# Patient Record
Sex: Female | Born: 1960 | Race: Black or African American | Hispanic: No | Smoking: Former smoker
Health system: Southern US, Community
[De-identification: ages and names within clinical notes are randomized; demographics above are authoritative.]

## PROBLEM LIST (undated history)

## (undated) DIAGNOSIS — E785 Hyperlipidemia, unspecified: Secondary | ICD-10-CM

## (undated) DIAGNOSIS — E119 Type 2 diabetes mellitus without complications: Secondary | ICD-10-CM

## (undated) HISTORY — DX: Type 2 diabetes mellitus without complications: E11.9

## (undated) HISTORY — DX: Hyperlipidemia, unspecified: E78.5

---

## 2000-11-08 HISTORY — PX: ABDOMINAL HYSTERECTOMY: SHX81

## 2002-08-19 ENCOUNTER — Emergency Department (HOSPITAL_COMMUNITY): Admission: EM | Admit: 2002-08-19 | Discharge: 2002-08-19 | Payer: Self-pay | Admitting: Emergency Medicine

## 2002-08-19 ENCOUNTER — Encounter: Payer: Self-pay | Admitting: Emergency Medicine

## 2003-09-13 ENCOUNTER — Other Ambulatory Visit: Admission: RE | Admit: 2003-09-13 | Discharge: 2003-09-13 | Payer: Self-pay | Admitting: Family Medicine

## 2003-10-07 ENCOUNTER — Ambulatory Visit (HOSPITAL_COMMUNITY): Admission: RE | Admit: 2003-10-07 | Discharge: 2003-10-07 | Payer: Self-pay | Admitting: Family Medicine

## 2004-12-30 ENCOUNTER — Other Ambulatory Visit: Admission: RE | Admit: 2004-12-30 | Discharge: 2004-12-30 | Payer: Self-pay | Admitting: Family Medicine

## 2006-02-01 ENCOUNTER — Other Ambulatory Visit: Admission: RE | Admit: 2006-02-01 | Discharge: 2006-02-01 | Payer: Self-pay | Admitting: Family Medicine

## 2007-04-05 ENCOUNTER — Other Ambulatory Visit: Admission: RE | Admit: 2007-04-05 | Discharge: 2007-04-05 | Payer: Self-pay | Admitting: Family Medicine

## 2008-05-22 ENCOUNTER — Ambulatory Visit (HOSPITAL_COMMUNITY): Admission: RE | Admit: 2008-05-22 | Discharge: 2008-05-22 | Payer: Self-pay | Admitting: Internal Medicine

## 2008-07-09 ENCOUNTER — Other Ambulatory Visit: Admission: RE | Admit: 2008-07-09 | Discharge: 2008-07-09 | Payer: Self-pay | Admitting: Family Medicine

## 2009-06-03 ENCOUNTER — Ambulatory Visit (HOSPITAL_COMMUNITY): Admission: RE | Admit: 2009-06-03 | Discharge: 2009-06-03 | Payer: Self-pay | Admitting: Internal Medicine

## 2009-06-11 ENCOUNTER — Encounter: Admission: RE | Admit: 2009-06-11 | Discharge: 2009-06-11 | Payer: Self-pay | Admitting: Family Medicine

## 2009-08-15 ENCOUNTER — Other Ambulatory Visit: Admission: RE | Admit: 2009-08-15 | Discharge: 2009-08-15 | Payer: Self-pay | Admitting: Family Medicine

## 2010-09-22 ENCOUNTER — Other Ambulatory Visit: Admission: RE | Admit: 2010-09-22 | Discharge: 2010-09-22 | Payer: Self-pay | Admitting: Family Medicine

## 2013-11-19 ENCOUNTER — Other Ambulatory Visit

## 2013-11-19 DIAGNOSIS — Z0000 Encounter for general adult medical examination without abnormal findings: Secondary | ICD-10-CM

## 2013-11-19 LAB — HEMOGLOBIN A1C
Hgb A1c MFr Bld: 6.6 % — ABNORMAL HIGH (ref <5.7)
MEAN PLASMA GLUCOSE: 143 mg/dL — AB (ref <117)

## 2013-11-19 LAB — COMPLETE METABOLIC PANEL WITH GFR
ALBUMIN: 3.8 g/dL (ref 3.5–5.2)
ALT: 12 U/L (ref 0–35)
AST: 14 U/L (ref 0–37)
Alkaline Phosphatase: 91 U/L (ref 39–117)
BUN: 12 mg/dL (ref 6–23)
CALCIUM: 9.1 mg/dL (ref 8.4–10.5)
CHLORIDE: 106 meq/L (ref 96–112)
CO2: 28 mEq/L (ref 19–32)
CREATININE: 1.03 mg/dL (ref 0.50–1.10)
GFR, EST AFRICAN AMERICAN: 72 mL/min
GFR, EST NON AFRICAN AMERICAN: 63 mL/min
GLUCOSE: 103 mg/dL — AB (ref 70–99)
POTASSIUM: 4.8 meq/L (ref 3.5–5.3)
Sodium: 143 mEq/L (ref 135–145)
Total Bilirubin: 0.5 mg/dL (ref 0.3–1.2)
Total Protein: 7 g/dL (ref 6.0–8.3)

## 2013-11-19 LAB — LIPID PANEL
CHOL/HDL RATIO: 3.1 ratio
Cholesterol: 159 mg/dL (ref 0–200)
HDL: 52 mg/dL (ref >39)
LDL CALC: 93 mg/dL (ref 0–99)
TRIGLYCERIDES: 68 mg/dL (ref <150)
VLDL: 14 mg/dL (ref 0–40)

## 2013-11-19 LAB — TSH: TSH: 1.861 u[IU]/mL (ref 0.350–4.500)

## 2013-11-20 LAB — VITAMIN D 25 HYDROXY (VIT D DEFICIENCY, FRACTURES): Vit D, 25-Hydroxy: 15 ng/mL — ABNORMAL LOW (ref 30–89)

## 2013-11-26 ENCOUNTER — Telehealth: Payer: Self-pay | Admitting: Family Medicine

## 2013-11-26 ENCOUNTER — Ambulatory Visit (INDEPENDENT_AMBULATORY_CARE_PROVIDER_SITE_OTHER): Admitting: Family Medicine

## 2013-11-26 ENCOUNTER — Encounter: Payer: Self-pay | Admitting: Family Medicine

## 2013-11-26 VITALS — BP 120/80 | HR 78 | Temp 97.7°F | Resp 18 | Ht 64.5 in | Wt 264.0 lb

## 2013-11-26 DIAGNOSIS — E119 Type 2 diabetes mellitus without complications: Secondary | ICD-10-CM | POA: Insufficient documentation

## 2013-11-26 DIAGNOSIS — E559 Vitamin D deficiency, unspecified: Secondary | ICD-10-CM

## 2013-11-26 DIAGNOSIS — M79609 Pain in unspecified limb: Secondary | ICD-10-CM

## 2013-11-26 DIAGNOSIS — Z1231 Encounter for screening mammogram for malignant neoplasm of breast: Secondary | ICD-10-CM

## 2013-11-26 DIAGNOSIS — Z0000 Encounter for general adult medical examination without abnormal findings: Secondary | ICD-10-CM

## 2013-11-26 DIAGNOSIS — M79672 Pain in left foot: Secondary | ICD-10-CM

## 2013-11-26 DIAGNOSIS — Z23 Encounter for immunization: Secondary | ICD-10-CM

## 2013-11-26 LAB — MICROALBUMIN / CREATININE URINE RATIO
CREATININE, URINE: 192 mg/dL
MICROALB UR: 0.5 mg/dL (ref 0.00–1.89)
Microalb Creat Ratio: 2.6 mg/g (ref 0.0–30.0)

## 2013-11-26 MED ORDER — VITAMIN D (ERGOCALCIFEROL) 1.25 MG (50000 UNIT) PO CAPS: 50000.0000 [IU] | ORAL_CAPSULE | ORAL | Status: DC

## 2013-11-26 NOTE — Addendum Note (Signed)
Addended by: Elvina MattesSIMMONS, Kinnick Maus T on: 11/26/2013 04:26 PM   Modules accepted: Orders

## 2013-11-26 NOTE — Progress Notes (Signed)
   Subjective:    Patient ID: Angela Bates, female    DOB: 07/25/1961, 53 y.o.   MRN: [id number]  HPI  Patient here for physical exam. She's concerned about left foot pain for the past 6 months. She began having throbbing in her left foot became out of nowhere she subsequently had a young child fall with her ankles she's had increased pain since then. She states it typically stays swollen even though she tries elevated. She's not tried any over-the-counter medications. She's not had any imaging of the foot. She did notice that the skin became darker around the ankle where she has pain. She has history of borderline diabetes mellitus her last A1c was 5.8% in 2012  She is due for mammogram She's not a candidate for Pap smear secondary to hysterectomy for fibroid uterus Colonoscopy was done 08/06/11 and was normal to be repeated in 10 years    Review of Systems  GEN- denies fatigue, fever, weight loss,weakness, recent illness HEENT- denies eye drainage, change in vision, nasal discharge, CVS- denies chest pain, palpitations RESP- denies SOB, cough, wheeze ABD- denies N/V, change in stools, abd pain GU- denies dysuria, hematuria, dribbling, incontinence MSK- + joint pain, muscle aches, injury Neuro- denies headache, dizziness, syncope, seizure activity      Objective:   Physical Exam GEN- NAD, alert and oriented x3, obese HEENT- PERRL, EOMI, non injected sclera, pink conjunctiva, MMM, oropharynx clear Neck- Supple, no thryomegaly CVS- RRR, no murmur RESP-CTAB ABD-NABS,soft,NT,ND GU-Deferred EXT- trace left pedal edema Pulses- Radial, DP- 2+ MSK- bilat flat, feet- swelling medial malleous left foot, hyperpigementation over ankle region, TTP malleous, neg squeeze test, good ROM        Assessment & Plan:   V70.0- Exam done, fasting labs, TDAP/Flu shot given    Mammogram to be done  Fasting labs were reviewed

## 2013-11-26 NOTE — Assessment & Plan Note (Addendum)
She's not to herself and to the diabetic category. Her last A1c was 5.8% 2 years ago. She is now 6.6% today. She would is really adamant about working on her diet and weight loss before starting medications and I think this is reasonable at this early time. Will give her 3 months to work on dietary and lifestyle changes. She was shown how to use a glucometer so that she can monitor her CBGs a few times a week and also she begins to feels sick. FLP, CMET wnl

## 2013-11-26 NOTE — Telephone Encounter (Signed)
Pt has a question about her medication Call back number [phone]

## 2013-11-26 NOTE — Telephone Encounter (Signed)
Pt called back and spoke about the medications

## 2013-11-26 NOTE — Assessment & Plan Note (Signed)
Treat with ergocalciferol x12 weeks

## 2013-11-26 NOTE — Patient Instructions (Addendum)
Referral for Mammogram Referral for eye doctor-[place] Tetanus Booster/Flu shot  Meter to test blood sugar -test fasting- M,W,F goal < 90-120 morning  Review handout on foods Multivitamin once  A day Vitamin D for 12 weeks Xray of left foot  Take aleve for the foot and elevate F/U 3 months Diabetes Meal Planning Guide The diabetes meal planning guide is a tool to help you plan your meals and snacks. It is important for people with diabetes to manage their blood glucose (sugar) levels. Choosing the right foods and the right amounts throughout your day will help control your blood glucose. Eating right can even help you improve your blood pressure and reach or maintain a healthy weight. CARBOHYDRATE COUNTING MADE EASY When you eat carbohydrates, they turn to sugar. This raises your blood glucose level. Counting carbohydrates can help you control this level so you feel better. When you plan your meals by counting carbohydrates, you can have more flexibility in what you eat and balance your medicine with your food intake. Carbohydrate counting simply means adding up the total amount of carbohydrate grams in your meals and snacks. Try to eat about the same amount at each meal. Foods with carbohydrates are listed below. Each portion below is 1 carbohydrate serving or 15 grams of carbohydrates. Ask your dietician how many grams of carbohydrates you should eat at each meal or snack. Grains and Starches  1 slice bread.   English muffin or hotdog/hamburger bun.   cup cold cereal (unsweetened).   cup cooked pasta or rice.   cup starchy vegetables (corn, potatoes, peas, beans, winter squash).  1 tortilla (6 inches).   bagel.  1 waffle or pancake (size of a CD).   cup cooked cereal.  4 to 6 small crackers. *Whole grain is recommended. Fruit  1 cup fresh unsweetened berries, melon, papaya, pineapple.  1 small fresh fruit.   banana or mango.   cup fruit juice (4 oz  unsweetened).   cup canned fruit in natural juice or water.  2 tbs dried fruit.  12 to 15 grapes or cherries. Milk and Yogurt  1 cup fat-free or 1% milk.  1 cup soy milk.  6 oz light yogurt with sugar-free sweetener.  6 oz low-fat soy yogurt.  6 oz plain yogurt. Vegetables  1 cup raw or  cup cooked is counted as 0 carbohydrates or a "free" food.  If you eat 3 or more servings at 1 meal, count them as 1 carbohydrate serving. Other Carbohydrates   oz chips or pretzels.   cup ice cream or frozen yogurt.   cup sherbet or sorbet.  2 inch square cake, no frosting.  1 tbs honey, sugar, jam, jelly, or syrup.  2 small cookies.  3 squares of graham crackers.  3 cups popcorn.  6 crackers.  1 cup broth-based soup.  Count 1 cup casserole or other mixed foods as 2 carbohydrate servings.  Foods with less than 20 calories in a serving may be counted as 0 carbohydrates or a "free" food. You may want to purchase a book or computer software that lists the carbohydrate gram counts of different foods. In addition, the nutrition facts panel on the labels of the foods you eat are a good source of this information. The label will tell you how big the serving size is and the total number of carbohydrate grams you will be eating per serving. Divide this number by 15 to obtain the number of carbohydrate servings in a portion. Remember,  1 carbohydrate serving equals 15 grams of carbohydrate. SERVING SIZES Measuring foods and serving sizes helps you make sure you are getting the right amount of food. The list below tells how big or small some common serving sizes are.  1 oz.........4 stacked dice.  3 oz........Marland Kitchen.Deck of cards.  1 tsp.......Marland Kitchen.Tip of little finger.  1 tbs......Marland Kitchen.Marland Kitchen.Thumb.  2 tbs.......Marland Kitchen.Golf ball.   cup......Marland Kitchen.Half of a fist.  1 cup.......Marland Kitchen.A fist. SAMPLE DIABETES MEAL PLAN Below is a sample meal plan that includes foods from the grain and starches, dairy, vegetable,  fruit, and meat groups. A dietician can individualize a meal plan to fit your calorie needs and tell you the number of servings needed from each food group. However, controlling the total amount of carbohydrates in your meal or snack is more important than making sure you include all of the food groups at every meal. You may interchange carbohydrate containing foods (dairy, starches, and fruits). The meal plan below is an example of a 2000 calorie diet using carbohydrate counting. This meal plan has 17 carbohydrate servings. Breakfast  1 cup oatmeal (2 carb servings).   cup light yogurt (1 carb serving).  1 cup blueberries (1 carb serving).   cup almonds. Snack  1 large apple (2 carb servings).  1 low-fat string cheese stick. Lunch  Chicken breast salad.  1 cup spinach.   cup chopped tomatoes.  2 oz chicken breast, sliced.  2 tbs low-fat [place] dressing.  12 whole-wheat crackers (2 carb servings).  12 to 15 grapes (1 carb serving).  1 cup low-fat milk (1 carb serving). Snack  1 cup carrots.   cup hummus (1 carb serving). Dinner  3 oz broiled salmon.  1 cup brown rice (3 carb servings). Snack  1  cups steamed broccoli (1 carb serving) drizzled with 1 tsp olive oil and lemon juice.  1 cup light pudding (2 carb servings). DIABETES MEAL PLANNING WORKSHEET Your dietician can use this worksheet to help you decide how many servings of foods and what types of foods are right for you.  BREAKFAST Food Group and Servings / Carb Servings Grain/Starches __________________________________ Dairy __________________________________________ Vegetable ______________________________________ Fruit ___________________________________________ Meat __________________________________________ Fat ____________________________________________ LUNCH Food Group and Servings / Carb Servings Grain/Starches ___________________________________ Dairy  ___________________________________________ Fruit ____________________________________________ Meat ___________________________________________ Fat _____________________________________________ Laural GoldenINNER Food Group and Servings / Carb Servings Grain/Starches ___________________________________ Dairy ___________________________________________ Fruit ____________________________________________ Meat ___________________________________________ Fat _____________________________________________ SNACKS Food Group and Servings / Carb Servings Grain/Starches ___________________________________ Dairy ___________________________________________ Vegetable _______________________________________ Fruit ____________________________________________ Meat ___________________________________________ Fat _____________________________________________ DAILY TOTALS Starches _________________________ Vegetable ________________________ Fruit ____________________________ Dairy ____________________________ Meat ____________________________ Fat ______________________________ Document Released: 07/22/2005 Document Revised: 01/17/2012 Document Reviewed: 06/02/2009 [employer] Patient Information 2014 [employer]

## 2013-11-26 NOTE — Assessment & Plan Note (Signed)
Short-term weight loss goal set. Given handouts on diet

## 2013-11-30 ENCOUNTER — Encounter: Payer: Self-pay | Admitting: *Deleted

## 2013-12-05 ENCOUNTER — Ambulatory Visit (HOSPITAL_COMMUNITY)
Admission: RE | Admit: 2013-12-05 | Discharge: 2013-12-05 | Disposition: A | Discharge status: Home / Self Care | Source: Ambulatory Visit | Attending: Family Medicine | Admitting: Family Medicine

## 2013-12-05 DIAGNOSIS — M79672 Pain in left foot: Secondary | ICD-10-CM

## 2013-12-05 DIAGNOSIS — M25579 Pain in unspecified ankle and joints of unspecified foot: Secondary | ICD-10-CM | POA: Insufficient documentation

## 2013-12-07 NOTE — Progress Notes (Signed)
LMTRC

## 2013-12-17 ENCOUNTER — Ambulatory Visit (HOSPITAL_COMMUNITY)
Admission: RE | Admit: 2013-12-17 | Discharge: 2013-12-17 | Disposition: A | Discharge status: Home / Self Care | Source: Ambulatory Visit | Attending: Family Medicine | Admitting: Family Medicine

## 2013-12-17 DIAGNOSIS — Z1231 Encounter for screening mammogram for malignant neoplasm of breast: Secondary | ICD-10-CM

## 2014-02-11 ENCOUNTER — Ambulatory Visit
Admission: RE | Admit: 2014-02-11 | Discharge: 2014-02-11 | Disposition: A | Discharge status: Home / Self Care | Source: Ambulatory Visit | Attending: Family Medicine | Admitting: Family Medicine

## 2014-02-11 ENCOUNTER — Encounter: Payer: Self-pay | Admitting: Family Medicine

## 2014-02-11 ENCOUNTER — Ambulatory Visit (INDEPENDENT_AMBULATORY_CARE_PROVIDER_SITE_OTHER): Admitting: Family Medicine

## 2014-02-11 VITALS — BP 138/72 | HR 68 | Temp 98.1°F | Resp 14 | Ht 64.5 in | Wt 253.0 lb

## 2014-02-11 DIAGNOSIS — M25579 Pain in unspecified ankle and joints of unspecified foot: Secondary | ICD-10-CM

## 2014-02-11 DIAGNOSIS — M21619 Bunion of unspecified foot: Secondary | ICD-10-CM

## 2014-02-11 LAB — URIC ACID: Uric Acid, Serum: 4.7 mg/dL (ref 2.4–7.0)

## 2014-02-11 MED ORDER — MELOXICAM 7.5 MG PO TABS: 7.5000 mg | ORAL_TABLET | Freq: Every day | ORAL | Status: DC

## 2014-02-11 NOTE — Patient Instructions (Signed)
Start meloxicam once a day Use ICE Referral to foot doctor We will call with labs F/U as previous

## 2014-02-11 NOTE — Assessment & Plan Note (Addendum)
She typically has some swelling across the midfoot as well as the ankle but without any injury. S he's also had swelling on her left foot as well in the past with a negative x-ray. Her ligaments appear to be intact. I will start her on meloxicam. I will check a uric acid level.  Her symptoms are not classic for diabetic neuropathy therefore I will send her to podiatry she also has some bunions that need to be addressed with her foot

## 2014-02-11 NOTE — Progress Notes (Signed)
Patient ID: Angela Bates, female   DOB: 07/05/1961, 53 y.o.   MRN: [id number]   Subjective:    Patient ID: Angela Bates, female    DOB: 05/01/1961, 53 y.o.   MRN: [id number]  Patient presents for R leg pain  patient here with right foot pain for the past week. She denies any particular injury. She has pain which felt like a burning sensation and then severe sharp pain across the top of her foot. When she bears weight on it she has pain as well. She did have a similar pain on her left foot the medial aspect of the ankle x-rays were negative. She's not taking any over-the-counter medication. She does have diabetes mellitus which is currently diet controlled. She denies any tingling or burning on the soles of her feet or in her toes. She denies any back pain or knee pain. She states that she has difficulty walking on the right foot for the past couple of days. He also complains of a bunion on her right foot.    Review Of Systems:  GEN- denies fatigue, fever, weight loss,weakness, recent illness MSK-+ joint pain, muscle aches, injury        Objective:    BP 138/72  Pulse 68  Temp(Src) 98.1 F (36.7 C) (Oral)  Resp 14  Ht 5' 4.5" (1.638 m)  Wt 253 lb (114.76 kg)  BMI 42.77 kg/m2 GEN- NAD, alert and oriented x3 MSK- Right foot swelling along midfoot and lateral malleolus, +squeeze test, pain with flexion of foot, pain with weight bearing. Ligaments in tact, no pain with ROM of ankle, Left foot, mild swelling at ankles, no pain with ROM Pulse- DP 2+, PT in tact,  Skin- small varicose veins on bilat feet, NT, no erythema, mild hyperpigmentation at heels and lateral aspect of feet        Assessment & Plan:      Problem List Items Addressed This Visit   Pain in joint, ankle and foot - Primary     She typically has some swelling across the midfoot as well as the ankle but without any injury. S he's also had swelling on her left foot as well in the past with a negative x-ray. Her  ligaments appear to be intact. I will start her on meloxicam. I will check a uric acid level.  Her symptoms are not classic for diabetic neuropathy therefore I will send her to podiatry she also has some bunions that need to be addressed with her foot    Relevant Orders      Uric Acid      Ambulatory referral to Podiatry   Bunion of great toe   Relevant Orders      Ambulatory referral to Podiatry      Note: This dictation was prepared with Dragon dictation along with smaller phrase technology. Any transcriptional errors that result from this process are unintentional.

## 2014-02-18 ENCOUNTER — Other Ambulatory Visit: Payer: Self-pay | Admitting: Family Medicine

## 2014-02-18 MED ORDER — VITAMIN D3 25 MCG (1000 UNIT) PO TABS: 1000.0000 [IU] | ORAL_TABLET | Freq: Every day | ORAL | Status: DC

## 2014-02-18 NOTE — Telephone Encounter (Signed)
Refill of Vitamin D2 denied.   OTC Vitamin D3 begun per protocol.

## 2014-02-25 ENCOUNTER — Encounter: Payer: Self-pay | Admitting: Family Medicine

## 2014-02-25 ENCOUNTER — Ambulatory Visit (INDEPENDENT_AMBULATORY_CARE_PROVIDER_SITE_OTHER): Admitting: Family Medicine

## 2014-02-25 VITALS — BP 136/90 | HR 64 | Temp 97.6°F | Resp 18 | Ht 65.0 in | Wt 253.0 lb

## 2014-02-25 DIAGNOSIS — E119 Type 2 diabetes mellitus without complications: Secondary | ICD-10-CM

## 2014-02-25 DIAGNOSIS — E559 Vitamin D deficiency, unspecified: Secondary | ICD-10-CM

## 2014-02-25 DIAGNOSIS — Z23 Encounter for immunization: Secondary | ICD-10-CM

## 2014-02-25 LAB — HEMOGLOBIN A1C
HEMOGLOBIN A1C: 6 % — AB (ref <5.7)
Mean Plasma Glucose: 126 mg/dL — ABNORMAL HIGH (ref <117)

## 2014-02-25 LAB — BASIC METABOLIC PANEL
BUN: 19 mg/dL (ref 6–23)
CALCIUM: 9.3 mg/dL (ref 8.4–10.5)
CO2: 28 meq/L (ref 19–32)
CREATININE: 0.95 mg/dL (ref 0.50–1.10)
Chloride: 104 mEq/L (ref 96–112)
GLUCOSE: 93 mg/dL (ref 70–99)
Potassium: 4.6 mEq/L (ref 3.5–5.3)
SODIUM: 141 meq/L (ref 135–145)

## 2014-02-25 NOTE — Progress Notes (Signed)
Patient ID: Angela Bates, female   DOB: 06/07/1961, 53 y.o.   MRN: [id number]   Subjective:    Patient ID: Angela Bates, female    DOB: 06/27/1961, 53 y.o.   MRN: [id number]  Patient presents for 3 month F/U  patient here to follow chronic medical problems. She's lost 11 pounds since her last visit 3 months ago. She was also noted to have an A1c of 6.6%. She was controlling her diabetes with diet for the past 3 months. Her cholesterol was within normal limits 3 months ago. She had recent eye exam by Dr. Delrae SawyersHecker Sugar fasting this morning was 108     Review Of Systems:  GEN- denies fatigue, fever, weight loss,weakness, recent illness HEENT- denies eye drainage, change in vision, nasal discharge, CVS- denies chest pain, palpitations RESP- denies SOB, cough, wheeze ABD- denies N/V, change in stools, abd pain GU- denies dysuria, hematuria, dribbling, incontinence MSK- denies joint pain, muscle aches, injury Neuro- denies headache, dizziness, syncope, seizure activity       Objective:    BP 136/90  Pulse 64  Temp(Src) 97.6 F (36.4 C) (Oral)  Resp 18  Ht 5\' 5"  (1.651 m)  Wt 253 lb (114.76 kg)  BMI 42.10 kg/m2 GEN- NAD, alert and oriented x3 HEENT- PERRL, EOMI, non injected sclera, pink conjunctiva, MMM, oropharynx clear CVS- RRR, no murmur RESP-CTAB ABD-NABS,soft,NT,ND EXT- trace pedal edema Pulses- Radial, DP- 2+        Assessment & Plan:      Problem List Items Addressed This Visit   Unspecified vitamin D deficiency   Relevant Orders      Vitamin D, 25-hydroxy   Type II or unspecified type diabetes mellitus without mention of complication, not stated as uncontrolled - Primary     Recheck A1c. We did discuss medications such a statin drugs and ACE inhibitor she has not completely against these if needed but she wants to limit how many medications she hasn't taken prefers to treat things with diet and exercise. I will check her renal function. Her LDL looks good. Her  urine microalbumin was also normal. She is right at the cutoff for diabetes mellitus therefore I will await labs before deciding on to start her on low-dose ACE or statin    Relevant Orders      Basic metabolic panel      Hemoglobin A1c      Pneumococcal polysaccharide vaccine 23-valent greater than or equal to 2yo subcutaneous/IM (Completed)   Morbid obesity     11 pound weight loss noted.     Other Visit Diagnoses   Need for prophylactic vaccination against Streptococcus pneumoniae (pneumococcus)        Relevant Orders       Pneumococcal polysaccharide vaccine 23-valent greater than or equal to 2yo subcutaneous/IM (Completed)       Note: This dictation was prepared with Dragon dictation along with smaller phrase technology. Any transcriptional errors that result from this process are unintentional.

## 2014-02-25 NOTE — Patient Instructions (Addendum)
Release of records- Dr. Elmer PickerHecker- last eye exam We will call with results  Pneumonia shot  F/U 4 months

## 2014-02-25 NOTE — Assessment & Plan Note (Signed)
11 pound weight loss noted.

## 2014-02-25 NOTE — Assessment & Plan Note (Signed)
Recheck A1c. We did discuss medications such a statin drugs and ACE inhibitor she has not completely against these if needed but she wants to limit how many medications she hasn't taken prefers to treat things with diet and exercise. I will check her renal function. Her LDL looks good. Her urine microalbumin was also normal. She is right at the cutoff for diabetes mellitus therefore I will await labs before deciding on to start her on low-dose ACE or statin

## 2014-02-26 LAB — VITAMIN D 25 HYDROXY (VIT D DEFICIENCY, FRACTURES): Vit D, 25-Hydroxy: 37 ng/mL (ref 30–89)

## 2014-02-28 ENCOUNTER — Other Ambulatory Visit: Payer: Self-pay | Admitting: *Deleted

## 2014-02-28 MED ORDER — PRAVASTATIN SODIUM 10 MG PO TABS: 10.0000 mg | ORAL_TABLET | Freq: Every day | ORAL | Status: DC

## 2014-03-08 ENCOUNTER — Ambulatory Visit (INDEPENDENT_AMBULATORY_CARE_PROVIDER_SITE_OTHER)

## 2014-03-08 VITALS — BP 108/52 | HR 79 | Resp 16 | Ht 66.0 in | Wt 250.0 lb

## 2014-03-08 DIAGNOSIS — M7750 Other enthesopathy of unspecified foot: Secondary | ICD-10-CM

## 2014-03-08 DIAGNOSIS — M1990 Unspecified osteoarthritis, unspecified site: Secondary | ICD-10-CM

## 2014-03-08 DIAGNOSIS — M778 Other enthesopathies, not elsewhere classified: Secondary | ICD-10-CM

## 2014-03-08 DIAGNOSIS — M779 Enthesopathy, unspecified: Secondary | ICD-10-CM

## 2014-03-08 NOTE — Progress Notes (Signed)
   Subjective:    Patient ID: Margy Clarksllison Brines, female    DOB: 06/17/1961, 53 y.o.   MRN: [id number]  HPI Comments: "I have pain in the top of my foot"  Patient c/o aching dorsal right foot for about 1 month. She said it started suddenly one morning when she got out of bed. The area swells of and on. No injury. She saw her PCP and Rx 'd meloxicam. Some relief but wears off midday.      Review of Systems  Musculoskeletal: Positive for gait problem.  All other systems reviewed and are negative.      Objective:   Physical Exam A objective findings as follows vascular status is intact with pedal pulses palpable DP pulse two over four PT plus one over 4 bilateral capillary refill time 3 seconds all digits epicritic and proprioceptive sensations intact and symmetric bilateral there is normal plantar response and DTRs patient has diabetes without complications the current time. Dermatologically skin color pigment normal hair growth absent nails normal trophic. Orthopedic biomechanical exam is rectus foot type with promontory changes on weightbearing x-rays reveal collapse of the medial column at Lisfranc's in the mid tarsus joint area there is some asymmetric joint space narrowing mid tarsus and Lisfranc joint on oblique and lateral projections no signs of obvious fracture or other osseous abnormality identified as mild HAV deformity mild digital contractures noted slight inferior calcaneal spurring and thickening plantar fascial structures noted as well.       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 Plan at this time patient placed in fascial strapping may recommendations for a stiff are stable firm soled shoe at all times recheck in 2 weeks for followup patient RE taking meloxicam for 7.50 g as needed and will continue to do so recommend warm compress ice packs as well she  recommendations for a firm stable shoe including crocs for around the house are given at this time should note patient currently is been wearing very flimsy type shoes or sandals or Luanna ColeMary J. exam no structure support this allowed medial column in the foot arch to collapse significantly. Followup in 2 weeks for reevaluation may be a strong candidate for orthoses based on progress  Alvan Dameichard Pami Wool DPM

## 2014-03-08 NOTE — Patient Instructions (Signed)
ICE INSTRUCTIONS  Apply ice or cold pack to the affected area at least 3 times a day for 10-15 minutes each time.  You should also use ice after prolonged activity or vigorous exercise.  Do not apply ice longer than 20 minutes at one time.  Always keep a cloth between your skin and the ice pack to prevent burns.  Being consistent and following these instructions will help control your symptoms.  We suggest you purchase a gel ice pack because they are reusable and do bit leak.  Some of them are designed to wrap around the area.  Use the method that works best for you.  Here are some other suggestions for icing.   Use a frozen bag of peas or corn-inexpensive and molds well to your body, usually stays frozen for 10 to 20 minutes.  Wet a towel with cold water and squeeze out the excess until it's damp.  Place in a bag in the freezer for 20 minutes. Then remove and use.  Alternate warm compress and ice pack 10 minutes each repeat 2 or 3 times at least once or twice everyday or every evening  Shoe recommendations consider athletic type shoe either new balance or Brooks or Asics. Make sure the shoe has a stiff or firm sole no flimsy shoes or flip-flops For dressier or casual shoe consider [place], [place], or [place]

## 2014-03-22 ENCOUNTER — Ambulatory Visit

## 2014-03-26 ENCOUNTER — Telehealth: Payer: Self-pay | Admitting: *Deleted

## 2014-03-26 NOTE — Telephone Encounter (Signed)
Call placed to patient.   Requested information about pravastatin. States that she has not had high cholesterol and she was concerned about why it was ordered. Advised that LDL is on the high end of normal and with her concurrent dx, MD wanted to add medication to prevent MI/CVA.

## 2014-03-26 NOTE — Telephone Encounter (Signed)
Message copied by Phillips OdorSIX, Trenese Haft H on Tue Mar 26, 2014 10:36 AM ------      Message from: Malvin JohnsBULLINS, SUSAN S      Created: Tue Mar 26, 2014  8:52 AM       [phone]      Patient would like to talk to you about resubmitting one of her medications to the pharmacy and about how she should take one of her medications was not specific on the med she was speaking of  ------

## 2014-04-09 ENCOUNTER — Other Ambulatory Visit: Payer: Self-pay | Admitting: Gastroenterology

## 2014-06-21 ENCOUNTER — Other Ambulatory Visit: Payer: Self-pay | Admitting: Family Medicine

## 2014-06-21 NOTE — Telephone Encounter (Signed)
Refill appropriate and filled per protocol. 

## 2014-06-28 ENCOUNTER — Encounter: Payer: Self-pay | Admitting: Family Medicine

## 2014-06-28 ENCOUNTER — Ambulatory Visit (INDEPENDENT_AMBULATORY_CARE_PROVIDER_SITE_OTHER): Admitting: Family Medicine

## 2014-06-28 VITALS — BP 100/62 | HR 68 | Temp 98.2°F | Resp 18 | Ht 66.0 in | Wt 256.0 lb

## 2014-06-28 DIAGNOSIS — E119 Type 2 diabetes mellitus without complications: Secondary | ICD-10-CM

## 2014-06-28 LAB — COMPREHENSIVE METABOLIC PANEL
ALK PHOS: 92 U/L (ref 39–117)
ALT: 13 U/L (ref 0–35)
AST: 14 U/L (ref 0–37)
Albumin: 3.8 g/dL (ref 3.5–5.2)
BILIRUBIN TOTAL: 0.3 mg/dL (ref 0.2–1.2)
BUN: 17 mg/dL (ref 6–23)
CO2: 26 meq/L (ref 19–32)
CREATININE: 1.1 mg/dL (ref 0.50–1.10)
Calcium: 9.1 mg/dL (ref 8.4–10.5)
Chloride: 105 mEq/L (ref 96–112)
Glucose, Bld: 104 mg/dL — ABNORMAL HIGH (ref 70–99)
Potassium: 5 mEq/L (ref 3.5–5.3)
SODIUM: 139 meq/L (ref 135–145)
TOTAL PROTEIN: 6.9 g/dL (ref 6.0–8.3)

## 2014-06-28 LAB — CBC WITH DIFFERENTIAL/PLATELET
BASOS ABS: 0 10*3/uL (ref 0.0–0.1)
Basophils Relative: 0 % (ref 0–1)
EOS ABS: 0.1 10*3/uL (ref 0.0–0.7)
EOS PCT: 1 % (ref 0–5)
HEMATOCRIT: 36.1 % (ref 36.0–46.0)
Hemoglobin: 11.7 g/dL — ABNORMAL LOW (ref 12.0–15.0)
LYMPHS ABS: 3.9 10*3/uL (ref 0.7–4.0)
LYMPHS PCT: 37 % (ref 12–46)
MCH: 22.8 pg — AB (ref 26.0–34.0)
MCHC: 32.4 g/dL (ref 30.0–36.0)
MCV: 70.2 fL — AB (ref 78.0–100.0)
MONO ABS: 0.5 10*3/uL (ref 0.1–1.0)
Monocytes Relative: 5 % (ref 3–12)
Neutro Abs: 6 10*3/uL (ref 1.7–7.7)
Neutrophils Relative %: 57 % (ref 43–77)
Platelets: 165 10*3/uL (ref 150–400)
RBC: 5.14 MIL/uL — ABNORMAL HIGH (ref 3.87–5.11)
RDW: 17.6 % — AB (ref 11.5–15.5)
WBC: 10.5 10*3/uL (ref 4.0–10.5)

## 2014-06-28 LAB — HEMOGLOBIN A1C
HEMOGLOBIN A1C: 6.4 % — AB (ref <5.7)
MEAN PLASMA GLUCOSE: 137 mg/dL — AB (ref <117)

## 2014-06-28 LAB — LIPID PANEL
CHOL/HDL RATIO: 2.4 ratio
CHOLESTEROL: 136 mg/dL (ref 0–200)
HDL: 56 mg/dL (ref >39)
LDL CALC: 73 mg/dL (ref 0–99)
Triglycerides: 37 mg/dL (ref <150)
VLDL: 7 mg/dL (ref 0–40)

## 2014-06-28 MED ORDER — CHOLECALCIFEROL 25 MCG (1000 UT) PO CAPS: ORAL_CAPSULE | ORAL | Status: DC

## 2014-06-28 NOTE — Patient Instructions (Signed)
Continue current medications F/U 4 months  

## 2014-06-28 NOTE — Progress Notes (Signed)
Patient ID: Angela Bates, female   DOB: 04/24/1961, 53 y.o.   MRN: [id number]   Subjective:    Patient ID: Angela Clarksllison Dedmon, female    DOB: 11/30/1960, 53 y.o.   MRN: [id number]  Patient presents for Med check   Patient here to follow chronic medical problems. She's no specific concerns. She's diabetes mellitus which is diet controlled she typically checks her sugar 2-3 times a week just to keep an eye on it is stable in the low 100s. She's been taking her pravastatin for cardiovascular risk reduction without any difficulties. She's gained 6 pounds back on her previous weight loss she's had a stressful summer as her son was doing with the mental health issues in her uncle who've she was caring for was hospitalized. She denies any feelings of depression or anxiety. Medications were reviewed immunizations are up-to-date    Review Of Systems:  GEN- denies fatigue, fever, weight loss,weakness, recent illness HEENT- denies eye drainage, change in vision, nasal discharge, CVS- denies chest pain, palpitations RESP- denies SOB, cough, wheeze ABD- denies N/V, change in stools, abd pain GU- denies dysuria, hematuria, dribbling, incontinence MSK- denies joint pain, muscle aches, injury Neuro- denies headache, dizziness, syncope, seizure activity       Objective:    BP 100/62  Pulse 68  Temp(Src) 98.2 F (36.8 C) (Oral)  Resp 18  Ht 5\' 6"  (1.676 m)  Wt 256 lb (116.121 kg)  BMI 41.34 kg/m2 GEN- NAD, alert and oriented x3 HEENT- PERRL, EOMI, non injected sclera, pink conjunctiva, MMM, oropharynx clear CVS- RRR, no murmur RESP-CTAB Psych- normal affect and mood EXT- No edema Pulses- Radial, DP- 2+ Skin- feet callus bilat, rough texture        Assessment & Plan:      Problem List Items Addressed This Visit   Type II or unspecified type diabetes mellitus without mention of complication, not stated as uncontrolled - Primary   Relevant Orders      Lipid panel      Hemoglobin A1c   Comprehensive metabolic panel      CBC with Differential   Morbid obesity   Relevant Orders      Lipid panel      Note: This dictation was prepared with Dragon dictation along with smaller phrase technology. Any transcriptional errors that result from this process are unintentional.

## 2014-06-28 NOTE — Assessment & Plan Note (Signed)
Continue to work on weight loss and diet she's rededicate herself

## 2014-06-28 NOTE — Assessment & Plan Note (Signed)
Recheck A1c her diabetes is currently diet controlled. We discussed proper foot care as well. She does have a little callus lesion which continues to grow back he does cause some discomfort she was seen by podiatry in the past and they removed it but told her it would continue to come back therefore she declines going back to podiatry. Continue statin drug at this time. I'll recheck her fasting labs

## 2014-10-28 ENCOUNTER — Ambulatory Visit (INDEPENDENT_AMBULATORY_CARE_PROVIDER_SITE_OTHER): Admitting: Family Medicine

## 2014-10-28 ENCOUNTER — Encounter: Payer: Self-pay | Admitting: Family Medicine

## 2014-10-28 VITALS — BP 126/64 | HR 78 | Temp 99.2°F | Resp 16 | Ht 66.0 in | Wt 265.0 lb

## 2014-10-28 DIAGNOSIS — R300 Dysuria: Secondary | ICD-10-CM

## 2014-10-28 DIAGNOSIS — Z23 Encounter for immunization: Secondary | ICD-10-CM

## 2014-10-28 DIAGNOSIS — M545 Low back pain, unspecified: Secondary | ICD-10-CM

## 2014-10-28 DIAGNOSIS — F4329 Adjustment disorder with other symptoms: Secondary | ICD-10-CM

## 2014-10-28 DIAGNOSIS — E119 Type 2 diabetes mellitus without complications: Secondary | ICD-10-CM

## 2014-10-28 LAB — URINALYSIS, ROUTINE W REFLEX MICROSCOPIC
BILIRUBIN URINE: NEGATIVE
Glucose, UA: NEGATIVE mg/dL
HGB URINE DIPSTICK: NEGATIVE
KETONES UR: NEGATIVE mg/dL
Leukocytes, UA: NEGATIVE
Nitrite: NEGATIVE
PH: 6.5 (ref 5.0–8.0)
Protein, ur: NEGATIVE mg/dL
SPECIFIC GRAVITY, URINE: 1.025 (ref 1.005–1.030)
Urobilinogen, UA: 0.2 mg/dL (ref 0.0–1.0)

## 2014-10-28 LAB — COMPREHENSIVE METABOLIC PANEL
ALBUMIN: 3.9 g/dL (ref 3.5–5.2)
ALK PHOS: 95 U/L (ref 39–117)
ALT: 13 U/L (ref 0–35)
AST: 15 U/L (ref 0–37)
BILIRUBIN TOTAL: 0.7 mg/dL (ref 0.2–1.2)
BUN: 15 mg/dL (ref 6–23)
CO2: 29 mEq/L (ref 19–32)
Calcium: 9.3 mg/dL (ref 8.4–10.5)
Chloride: 104 mEq/L (ref 96–112)
Creat: 1.12 mg/dL — ABNORMAL HIGH (ref 0.50–1.10)
Glucose, Bld: 98 mg/dL (ref 70–99)
POTASSIUM: 4.8 meq/L (ref 3.5–5.3)
SODIUM: 141 meq/L (ref 135–145)
TOTAL PROTEIN: 6.9 g/dL (ref 6.0–8.3)

## 2014-10-28 LAB — CBC WITH DIFFERENTIAL/PLATELET
BASOS ABS: 0 10*3/uL (ref 0.0–0.1)
BASOS PCT: 0 % (ref 0–1)
EOS ABS: 0.1 10*3/uL (ref 0.0–0.7)
Eosinophils Relative: 1 % (ref 0–5)
HCT: 39.4 % (ref 36.0–46.0)
HEMOGLOBIN: 12.3 g/dL (ref 12.0–15.0)
Lymphocytes Relative: 36 % (ref 12–46)
Lymphs Abs: 3.7 10*3/uL (ref 0.7–4.0)
MCH: 22.4 pg — ABNORMAL LOW (ref 26.0–34.0)
MCHC: 31.2 g/dL (ref 30.0–36.0)
MCV: 71.6 fL — ABNORMAL LOW (ref 78.0–100.0)
Monocytes Absolute: 0.6 10*3/uL (ref 0.1–1.0)
Monocytes Relative: 6 % (ref 3–12)
NEUTROS ABS: 5.9 10*3/uL (ref 1.7–7.7)
NEUTROS PCT: 57 % (ref 43–77)
PLATELETS: 159 10*3/uL (ref 150–400)
RBC: 5.5 MIL/uL — AB (ref 3.87–5.11)
RDW: 17.2 % — AB (ref 11.5–15.5)
WBC: 10.4 10*3/uL (ref 4.0–10.5)

## 2014-10-28 LAB — LIPID PANEL
Cholesterol: 162 mg/dL (ref 0–200)
HDL: 53 mg/dL (ref >39)
LDL CALC: 92 mg/dL (ref 0–99)
TRIGLYCERIDES: 84 mg/dL (ref <150)
Total CHOL/HDL Ratio: 3.1 Ratio
VLDL: 17 mg/dL (ref 0–40)

## 2014-10-28 NOTE — Assessment & Plan Note (Signed)
No red flags, supportive church family

## 2014-10-28 NOTE — Assessment & Plan Note (Signed)
Recheck A1C, concern she may need MTF based on weight gain and if A1C still above > 6.5% Continue statin drug

## 2014-10-28 NOTE — Assessment & Plan Note (Signed)
Discussed dietary changes, will start aerobic exercise as well 3 days a week

## 2014-10-28 NOTE — Assessment & Plan Note (Addendum)
No red flags, no radiating  symptoms, advised heating pad, stretching, weight loss, aleve prn, she declines regular use of meds UA normal, doubt UTI

## 2014-10-28 NOTE — Progress Notes (Signed)
Patient ID: Angela Bates, female   DOB: 06/19/1961, 53 y.o.   MRN: [id number]   Subjective:    Patient ID: Angela Bates, female    DOB: 02/16/1961, 53 y.o.   MRN: [id number]  Patient presents for 4 month F/U and UTI   patient had a follow-up chronic medical problems she does complain of some mild dysuria on and off as well as some low back pain for the past couple of months. No current dysuria and no blood in urine. No change in bowels. She's not had any significant fever. No nausea vomiting associated. She is gaining some weight and thinks it might be due to weight gain as she feels pain in her right side and lower back when she moves at times. His history of diabetes mellitus which is diet-controlled her last A1c was at 6.6%. She is taking her cholesterol medicine most days.  She tells me that she has been stress eating secondary to her adult sons who've been having some difficulties. She does not want any medications for help. She has a very supportive church.    Review Of Systems:  GEN- denies fatigue, fever, weight loss,weakness, recent illness HEENT- denies eye drainage, change in vision, nasal discharge, CVS- denies chest pain, palpitations RESP- denies SOB, cough, wheeze ABD- denies N/V, change in stools, abd pain GU- denies dysuria, hematuria, dribbling, incontinence MSK-+ joint pain, muscle aches, injury Neuro- denies headache, dizziness, syncope, seizure activity       Objective:    BP 126/64 mmHg  Pulse 78  Temp(Src) 99.2 F (37.3 C) (Oral)  Resp 16  Ht 5\' 6"  (1.676 m)  Wt 265 lb (120.203 kg)  BMI 42.79 kg/m2 GEN- NAD, alert and oriented x3 HEENT- PERRL, EOMI, non injected sclera, pink conjunctiva, MMM, oropharynx clear CVS- RRR, no murmur RESP-CTAB ABD-NABS,soft,NT,ND, no CVA tenderness Spine- NT, neg SLR, good ROM EXT- No edema Pulses- Radial 2+ Psych- tearful discussing sons, not anxious appearing, not overly depressed, no SI, normal speech, well  groomed        Assessment & Plan:      Problem List Items Addressed This Visit      Unprioritized   Morbid obesity   Diabetes mellitus type II, controlled - Primary    Other Visit Diagnoses    Dysuria        Relevant Orders       Urinalysis, Routine w reflex microscopic       Note: This dictation was prepared with Dragon dictation along with smaller phrase technology. Any transcriptional errors that result from this process are unintentional.

## 2014-10-28 NOTE — Patient Instructions (Signed)
Take the ibuprofen or aleve as needed for pain Flu shot given Urine test was normal Goal 10 pound weight loss F/U 4 months

## 2014-10-29 ENCOUNTER — Encounter: Payer: Self-pay | Admitting: *Deleted

## 2014-10-29 LAB — HEMOGLOBIN A1C
HEMOGLOBIN A1C: 6.4 % — AB (ref <5.7)
Mean Plasma Glucose: 137 mg/dL — ABNORMAL HIGH (ref <117)

## 2015-02-27 ENCOUNTER — Encounter: Payer: Self-pay | Admitting: Family Medicine

## 2015-03-03 ENCOUNTER — Ambulatory Visit: Admitting: Family Medicine

## 2015-03-10 ENCOUNTER — Encounter: Payer: Self-pay | Admitting: Family Medicine

## 2015-03-10 ENCOUNTER — Ambulatory Visit (INDEPENDENT_AMBULATORY_CARE_PROVIDER_SITE_OTHER): Admitting: Family Medicine

## 2015-03-10 ENCOUNTER — Other Ambulatory Visit: Payer: Self-pay | Admitting: Family Medicine

## 2015-03-10 VITALS — BP 128/66 | HR 70 | Temp 98.4°F | Resp 16 | Ht 66.0 in | Wt 264.0 lb

## 2015-03-10 DIAGNOSIS — E119 Type 2 diabetes mellitus without complications: Secondary | ICD-10-CM | POA: Diagnosis not present

## 2015-03-10 DIAGNOSIS — Z1239 Encounter for other screening for malignant neoplasm of breast: Secondary | ICD-10-CM

## 2015-03-10 DIAGNOSIS — E1169 Type 2 diabetes mellitus with other specified complication: Secondary | ICD-10-CM | POA: Insufficient documentation

## 2015-03-10 DIAGNOSIS — E785 Hyperlipidemia, unspecified: Secondary | ICD-10-CM | POA: Diagnosis not present

## 2015-03-10 LAB — COMPREHENSIVE METABOLIC PANEL
ALT: 13 U/L (ref 0–35)
AST: 15 U/L (ref 0–37)
Albumin: 3.8 g/dL (ref 3.5–5.2)
Alkaline Phosphatase: 101 U/L (ref 39–117)
BUN: 15 mg/dL (ref 6–23)
CO2: 29 meq/L (ref 19–32)
Calcium: 9.2 mg/dL (ref 8.4–10.5)
Chloride: 106 mEq/L (ref 96–112)
Creat: 1.19 mg/dL — ABNORMAL HIGH (ref 0.50–1.10)
Glucose, Bld: 109 mg/dL — ABNORMAL HIGH (ref 70–99)
Potassium: 4.4 mEq/L (ref 3.5–5.3)
Sodium: 141 mEq/L (ref 135–145)
Total Bilirubin: 0.5 mg/dL (ref 0.2–1.2)
Total Protein: 7.3 g/dL (ref 6.0–8.3)

## 2015-03-10 LAB — CBC WITH DIFFERENTIAL/PLATELET
BASOS PCT: 0 % (ref 0–1)
Basophils Absolute: 0 10*3/uL (ref 0.0–0.1)
Eosinophils Absolute: 0.1 10*3/uL (ref 0.0–0.7)
Eosinophils Relative: 1 % (ref 0–5)
HEMATOCRIT: 38.6 % (ref 36.0–46.0)
Hemoglobin: 12.4 g/dL (ref 12.0–15.0)
Lymphocytes Relative: 31 % (ref 12–46)
Lymphs Abs: 3.3 10*3/uL (ref 0.7–4.0)
MCH: 22.9 pg — ABNORMAL LOW (ref 26.0–34.0)
MCHC: 32.1 g/dL (ref 30.0–36.0)
MCV: 71.2 fL — AB (ref 78.0–100.0)
Monocytes Absolute: 0.5 10*3/uL (ref 0.1–1.0)
Monocytes Relative: 5 % (ref 3–12)
Neutro Abs: 6.8 10*3/uL (ref 1.7–7.7)
Neutrophils Relative %: 63 % (ref 43–77)
PLATELETS: 172 10*3/uL (ref 150–400)
RBC: 5.42 MIL/uL — ABNORMAL HIGH (ref 3.87–5.11)
RDW: 17.7 % — ABNORMAL HIGH (ref 11.5–15.5)
WBC: 10.8 10*3/uL — AB (ref 4.0–10.5)

## 2015-03-10 LAB — HEMOGLOBIN A1C
Hgb A1c MFr Bld: 6.3 % — ABNORMAL HIGH (ref <5.7)
Mean Plasma Glucose: 134 mg/dL — ABNORMAL HIGH (ref <117)

## 2015-03-10 MED ORDER — PRAVASTATIN SODIUM 10 MG PO TABS: 10.0000 mg | ORAL_TABLET | Freq: Every day | ORAL | Status: DC

## 2015-03-10 NOTE — Assessment & Plan Note (Signed)
Diet controlled recheck labs, dicussed weight loss

## 2015-03-10 NOTE — Patient Instructions (Signed)
Work on weight loss Schedule mammogram! We will call with lab results Restart multivitamin Continue the vitamin D  F/U 6 months for Physical

## 2015-03-10 NOTE — Assessment & Plan Note (Signed)
Short term goals set

## 2015-03-10 NOTE — Progress Notes (Signed)
Patient ID: Angela Bates, female   DOB: 09/01/1961, 54 y.o.   MRN: [id number]   Subjective:    Patient ID: Angela Clarksllison Siemon, female    DOB: 07/28/1961, 54 y.o.   MRN: [id number]  Patient presents for 4 month F/U  patient had a follow-up chronic medical problems. She has no particular concerns. She states that she is doing well. She actually graduated with her so she's degree for childhood education. Things have been doing okay at home with her son. She has not lost any significant weight since her last visit states that she has not changed her diet very much. She is due for repeat A1c her cholesterol is within normal limits with her pravastatin    Review Of Systems:  GEN- denies fatigue, fever, weight loss,weakness, recent illness HEENT- denies eye drainage, change in vision, nasal discharge, CVS- denies chest pain, palpitations RESP- denies SOB, cough, wheeze ABD- denies N/V, change in stools, abd pain GU- denies dysuria, hematuria, dribbling, incontinence MSK- denies joint pain, muscle aches, injury Neuro- denies headache, dizziness, syncope, seizure activity       Objective:    BP 128/66 mmHg  Pulse 70  Temp(Src) 98.4 F (36.9 C) (Oral)  Resp 16  Ht 5\' 6"  (1.676 m)  Wt 264 lb (119.75 kg)  BMI 42.63 kg/m2 GEN- NAD, alert and oriented x3,obese HEENT- PERRL, EOMI, non injected sclera, pink conjunctiva, MMM, oropharynx clear CVS- RRR, no murmur RESP-CTAB EXT- No edema Pulses- Radial  2+        Assessment & Plan:      Problem List Items Addressed This Visit    None      Note: This dictation was prepared with Dragon dictation along with smaller phrase technology. Any transcriptional errors that result from this process are unintentional.

## 2015-03-10 NOTE — Assessment & Plan Note (Signed)
Well controlled, no change to statin drug 

## 2015-03-11 LAB — MICROALBUMIN / CREATININE URINE RATIO
CREATININE, URINE: 292.5 mg/dL
MICROALB UR: 0.5 mg/dL (ref <2.0)
MICROALB/CREAT RATIO: 1.7 mg/g (ref 0.0–30.0)

## 2015-03-12 LAB — IRON AND TIBC
%SAT: 19 % — ABNORMAL LOW (ref 20–55)
Iron: 60 ug/dL (ref 42–145)
TIBC: 308 ug/dL (ref 250–470)
UIBC: 248 ug/dL (ref 125–400)

## 2015-03-28 ENCOUNTER — Ambulatory Visit (HOSPITAL_COMMUNITY)
Admission: RE | Admit: 2015-03-28 | Discharge: 2015-03-28 | Disposition: A | Discharge status: Home / Self Care | Source: Ambulatory Visit | Attending: Family Medicine | Admitting: Family Medicine

## 2015-03-28 DIAGNOSIS — Z1239 Encounter for other screening for malignant neoplasm of breast: Secondary | ICD-10-CM

## 2015-03-28 DIAGNOSIS — Z1231 Encounter for screening mammogram for malignant neoplasm of breast: Secondary | ICD-10-CM | POA: Insufficient documentation

## 2015-03-31 ENCOUNTER — Encounter: Payer: Self-pay | Admitting: Family Medicine

## 2015-05-05 ENCOUNTER — Ambulatory Visit (INDEPENDENT_AMBULATORY_CARE_PROVIDER_SITE_OTHER): Admitting: Family Medicine

## 2015-05-05 ENCOUNTER — Encounter: Payer: Self-pay | Admitting: Family Medicine

## 2015-05-05 VITALS — BP 128/68 | HR 72 | Temp 97.8°F | Resp 16 | Ht 66.0 in | Wt 264.0 lb

## 2015-05-05 DIAGNOSIS — Z0000 Encounter for general adult medical examination without abnormal findings: Secondary | ICD-10-CM | POA: Diagnosis not present

## 2015-05-05 DIAGNOSIS — Z111 Encounter for screening for respiratory tuberculosis: Secondary | ICD-10-CM | POA: Diagnosis not present

## 2015-05-05 NOTE — Progress Notes (Signed)
Patient ID: Angela Bates, female   DOB: May 05, 1961, 54 y.o.   MRN: [id number]   Subjective:    Patient ID: Angela Bates, female    DOB: Mar 27, 1961, 54 y.o.   MRN: [id number]  Patient presents for CPE  Patient here for complete physical exam. 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 She also needs a PPD placed. She has no concerns today. She states she started walking this week and knows that she needs to lose weight and she is eating too much fast food. Her preventative care is up-to-date with the exception of her eye exam for this year. She recently had fasting labs at her last visit in May   Review Of Systems:  GEN- denies fatigue, fever, weight loss,weakness, recent illness HEENT- denies eye drainage, change in vision, nasal discharge, CVS- denies chest pain, palpitations RESP- denies SOB, cough, wheeze ABD- denies N/V, change in stools, abd pain GU- denies dysuria, hematuria, dribbling, incontinence MSK- denies joint pain, muscle aches, injury Neuro- denies headache, dizziness, syncope, seizure activity       Objective:    BP 128/68 mmHg  Pulse 72  Temp(Src) 97.8 F (36.6 C) (Oral)  Resp 16  Ht 5\' 6"  (1.676 m)  Wt 264 lb (119.75 kg)  BMI 42.63 kg/m2 GEN- NAD, alert and oriented x3 HEENT- PERRL, EOMI, non injected sclera, pink conjunctiva, MMM, oropharynx clear Neck- Supple, no thyromegaly CVS- RRR, no murmur RESP-CTAB ABD-NABS,soft,NT,ND Psych- normal affect and mood Neuro-CNII-XII intact, no deficits MSK- fair ROM spine, hips, knees - decreased due to habitus EXT- No edema Pulses- Radial, DP- 2+        Assessment & Plan:      Problem List Items Addressed This Visit    None    Visit Diagnoses    Routine general medical examination at a health care facility    -  Primary    CPE done, immunizations UTD, return with form, 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 Any transcriptional errors that result from this process are unintentional.

## 2015-05-05 NOTE — Patient Instructions (Addendum)
Complete Physical Exam performed today.  Return in 48 hours to have PPD read Bring in form needed  Call Dr.Hecker [phone] F/U as previous

## 2015-05-07 ENCOUNTER — Ambulatory Visit: Admitting: *Deleted

## 2015-05-07 DIAGNOSIS — Z111 Encounter for screening for respiratory tuberculosis: Secondary | ICD-10-CM

## 2015-05-07 LAB — TB SKIN TEST
INDURATION: 0 mm
TB SKIN TEST: NEGATIVE

## 2015-07-25 ENCOUNTER — Other Ambulatory Visit: Payer: Self-pay | Admitting: Family Medicine

## 2015-07-25 NOTE — Telephone Encounter (Signed)
Medication refilled per protocol. 

## 2015-08-14 ENCOUNTER — Encounter: Payer: Self-pay | Admitting: Family Medicine

## 2015-08-14 ENCOUNTER — Ambulatory Visit (INDEPENDENT_AMBULATORY_CARE_PROVIDER_SITE_OTHER): Admitting: Family Medicine

## 2015-08-14 VITALS — BP 126/68 | HR 72 | Temp 98.5°F | Resp 18 | Ht 66.0 in | Wt 262.0 lb

## 2015-08-14 DIAGNOSIS — M1000 Idiopathic gout, unspecified site: Secondary | ICD-10-CM

## 2015-08-14 DIAGNOSIS — M109 Gout, unspecified: Secondary | ICD-10-CM

## 2015-08-14 LAB — URIC ACID: Uric Acid, Serum: 5.4 mg/dL (ref 2.4–7.0)

## 2015-08-14 MED ORDER — PREDNISONE 20 MG PO TABS: ORAL_TABLET | ORAL | Status: DC

## 2015-08-14 NOTE — Progress Notes (Signed)
   Subjective:    Patient ID: Angela Bates, female    DOB: [birth year], 54 y.o.   MRN: [id number]  HPI  Patient is a very pleasant 54 year old African-American female who presents with pain in her right foot. The pain occurred suddenly. She denies any injury to the foot. She is exquisitely tender to palpation over the midfoot. There is no erythema there there is no swelling. She has normal pulses in the foot. There is no swelling in the lower leg. However she does have erythema about the first MTP joint. 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 Apparently this is been a recurrent problem for the patient although she has never been told that she has gout. She is seen a podiatrist in the past for recurrent pain in her feet has even been treated with orthotics which seems to help. Past Medical History  Diagnosis Date  . Diabetes mellitus without complication [facility])    Past Surgical History  Procedure Laterality Date  . Abdominal hysterectomy  2002   Current Outpatient Prescriptions on File Prior to Visit  Medication Sig Dispense Refill  . CVS D3 1000 UNITS capsule TAKE ONE CAPSULE BY MOUTH EVERY DAY 90 capsule 3  . Multiple Vitamin (MULTIVITAMIN) capsule Take 1 capsule by mouth daily.    . pravastatin (PRAVACHOL) 10 MG tablet Take 1 tablet (10 mg total) by mouth daily. 90 tablet 3   No current facility-administered medications on file prior to visit.   No Known Allergies Social History   Social History  . Marital Status: Widowed    Spouse Name: N/A  . Number of Children: N/A  . Years of Education: N/A   Occupational History  . Not on file.   Social History Main Topics  . Smoking status: Former Games developer  . Smokeless tobacco: Never Used     Comment: quit >10 years prior  . Alcohol Use: No  . Drug Use: No  . Sexual Activity: No   Other Topics Concern  . Not on file   Social History Narrative     Review of Systems   All other systems reviewed and are negative.      Objective:   Physical Exam  Cardiovascular: Normal rate, regular rhythm and normal heart sounds.   Pulmonary/Chest: Effort normal and breath sounds normal.  Skin: Skin is warm. There is erythema.  Vitals reviewed.         Assessment & Plan:  Podagra - Plan: predniSONE (DELTASONE) 20 MG tablet, Uric acid  Patient does have a bunion over the right first MTP joint. 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 I also believe this could be an explanation for her recurrent intermittent pain in her right feet. Therefore I'll start the patient on a prednisone taper pack and check a uric acid level. I warned the patient about elevations in her blood sugar which will be temporary. Recheck in 24 hours if no better or sooner if worse

## 2015-08-18 ENCOUNTER — Encounter: Payer: Self-pay | Admitting: Family Medicine

## 2015-09-10 ENCOUNTER — Ambulatory Visit (INDEPENDENT_AMBULATORY_CARE_PROVIDER_SITE_OTHER): Admitting: Family Medicine

## 2015-09-10 ENCOUNTER — Encounter: Payer: Self-pay | Admitting: Family Medicine

## 2015-09-10 VITALS — BP 128/74 | HR 82 | Temp 98.0°F | Resp 16 | Ht 66.0 in | Wt 264.0 lb

## 2015-09-10 DIAGNOSIS — E785 Hyperlipidemia, unspecified: Secondary | ICD-10-CM | POA: Diagnosis not present

## 2015-09-10 DIAGNOSIS — E119 Type 2 diabetes mellitus without complications: Secondary | ICD-10-CM

## 2015-09-10 DIAGNOSIS — Z23 Encounter for immunization: Secondary | ICD-10-CM | POA: Diagnosis not present

## 2015-09-10 LAB — CBC WITH DIFFERENTIAL/PLATELET
BASOS PCT: 0 % (ref 0–1)
Basophils Absolute: 0 10*3/uL (ref 0.0–0.1)
EOS ABS: 0.1 10*3/uL (ref 0.0–0.7)
Eosinophils Relative: 1 % (ref 0–5)
HCT: 39.2 % (ref 36.0–46.0)
HEMOGLOBIN: 12.1 g/dL (ref 12.0–15.0)
Lymphocytes Relative: 35 % (ref 12–46)
Lymphs Abs: 3.7 10*3/uL (ref 0.7–4.0)
MCH: 22.4 pg — AB (ref 26.0–34.0)
MCHC: 30.9 g/dL (ref 30.0–36.0)
MCV: 72.6 fL — ABNORMAL LOW (ref 78.0–100.0)
MONO ABS: 0.5 10*3/uL (ref 0.1–1.0)
MONOS PCT: 5 % (ref 3–12)
NEUTROS ABS: 6.2 10*3/uL (ref 1.7–7.7)
Neutrophils Relative %: 59 % (ref 43–77)
PLATELETS: 157 10*3/uL (ref 150–400)
RBC: 5.4 MIL/uL — AB (ref 3.87–5.11)
RDW: 17.6 % — AB (ref 11.5–15.5)
WBC: 10.5 10*3/uL (ref 4.0–10.5)

## 2015-09-10 LAB — LIPID PANEL
Cholesterol: 148 mg/dL (ref 125–200)
HDL: 45 mg/dL — ABNORMAL LOW (ref >=46)
LDL CALC: 89 mg/dL (ref <130)
Total CHOL/HDL Ratio: 3.3 Ratio (ref <=5.0)
Triglycerides: 71 mg/dL (ref <150)
VLDL: 14 mg/dL (ref <30)

## 2015-09-10 LAB — HEMOGLOBIN A1C
HEMOGLOBIN A1C: 6.7 % — AB (ref <5.7)
MEAN PLASMA GLUCOSE: 146 mg/dL — AB (ref <117)

## 2015-09-10 NOTE — Progress Notes (Signed)
Patient ID: Angela Bates, female   DOB: 07/09/1961, 54 y.o.   MRN: [id number]   Subjective:    Patient ID: Angela Bates, female    DOB: 01/18/1961, 54 y.o.   MRN: [id number]  Patient presents for 6 month F/U  patient here to follow-up chronic medical problems. She has not had any significant weight loss since her last visit. She's not been exercising and she has been eating a lot of fast food. She is working 2 different jobs and often leaves the home around 5 AM and does not come home until 11 PM. She has no new concerns today. She states that things have settled out. She is diet-controlled diabetes and hyperlipidemia. She is due for repeat labs today    Review Of Systems:  GEN- denies fatigue, fever, weight loss,weakness, recent illness HEENT- denies eye drainage, change in vision, nasal discharge, CVS- denies chest pain, palpitations RESP- denies SOB, cough, wheeze ABD- denies N/V, change in stools, abd pain GU- denies dysuria, hematuria, dribbling, incontinence MSK- denies joint pain, muscle aches, injury Neuro- denies headache, dizziness, syncope, seizure activity       Objective:    BP 128/74 mmHg  Pulse 82  Temp(Src) 98 F (36.7 C) (Oral)  Resp 16  Ht 5\' 6"  (1.676 m)  Wt 264 lb (119.75 kg)  BMI 42.63 kg/m2 GEN- NAD, alert and oriented x3 HEENT- PERRL, EOMI, non injected sclera, pink conjunctiva, MMM, oropharynx clear Neck- Supple, no thyromegaly CVS- RRR, no murmur RESP-CTAB ABD-NABS,soft,NT,ND EXT- No edema Pulses- Radial, DP- 2+        Assessment & Plan:      Problem List Items Addressed This Visit    Morbid obesity (HCC)   Hyperlipidemia   Relevant Orders   Lipid panel   Diabetes mellitus type II, controlled (HCC)    Diet-controlled Will recheck A1c. Stressed the importance of dietary changes. We discussed taking her lunch in prepping meals for the week ahead. We also discussed her exercise regimen she will start working out on Saturday and Sunday and  trying to do at least 15 minutes of activity 2 days during the week      Relevant Orders   CBC with Differential/Platelet   Hemoglobin A1c    Other Visit Diagnoses    Need for prophylactic vaccination and inoculation against influenza    -  Primary    Relevant Orders    Flu Vaccine QUAD 36+ mos PF IM (Fluarix & Fluzone Quad PF) (Completed)       Note: This dictation was prepared with Dragon dictation along with smaller phrase technology. Any transcriptional errors that result from this process are unintentional.

## 2015-09-10 NOTE — Patient Instructions (Signed)
Work on preparing meals for dinner to 1 hour on the weekend Start 15 minutes 2 days a week  I will call with lab results  Flu shot given F/U 3 months

## 2015-09-10 NOTE — Assessment & Plan Note (Signed)
Diet-controlled Will recheck A1c. Stressed the importance of dietary changes. We discussed taking her lunch in prepping meals for the week ahead. We also discussed her exercise regimen she will start working out on Saturday and Sunday and trying to do at least 15 minutes of activity 2 days during the week

## 2015-10-20 ENCOUNTER — Encounter: Payer: Self-pay | Admitting: Family Medicine

## 2015-11-13 ENCOUNTER — Telehealth: Payer: Self-pay | Admitting: Family Medicine

## 2015-12-02 ENCOUNTER — Encounter: Payer: Self-pay | Admitting: Family Medicine

## 2015-12-16 ENCOUNTER — Ambulatory Visit: Admitting: Family Medicine

## 2016-11-23 ENCOUNTER — Encounter: Payer: Self-pay | Admitting: Family Medicine

## 2016-11-23 ENCOUNTER — Ambulatory Visit (INDEPENDENT_AMBULATORY_CARE_PROVIDER_SITE_OTHER): Admitting: Family Medicine

## 2016-11-23 VITALS — BP 112/58 | HR 84 | Temp 98.4°F | Resp 16 | Ht 66.0 in | Wt 257.0 lb

## 2016-11-23 DIAGNOSIS — R109 Unspecified abdominal pain: Secondary | ICD-10-CM | POA: Diagnosis not present

## 2016-11-23 DIAGNOSIS — K649 Unspecified hemorrhoids: Secondary | ICD-10-CM | POA: Diagnosis not present

## 2016-11-23 LAB — URINALYSIS, ROUTINE W REFLEX MICROSCOPIC
BILIRUBIN URINE: NEGATIVE
GLUCOSE, UA: NEGATIVE
HGB URINE DIPSTICK: NEGATIVE
KETONES UR: NEGATIVE
Leukocytes, UA: NEGATIVE
Nitrite: NEGATIVE
PROTEIN: NEGATIVE
Specific Gravity, Urine: >1.03 (ref 1.001–1.035)
pH: 5.5 (ref 5.0–8.0)

## 2016-11-23 MED ORDER — HYDROCORTISONE ACETATE 25 MG RE SUPP: 25.0000 mg | Freq: Two times a day (BID) | RECTAL | 0 refills | Status: DC | PRN

## 2016-11-23 NOTE — Patient Instructions (Signed)
Use the suppository as prescribed Try dulcolax or miralax see if this helps with pain F/U as previous for physical

## 2016-11-23 NOTE — Progress Notes (Signed)
   Subjective:    Patient ID: Angela Bates, female    DOB: 07/15/1961, 56 y.o.   MRN: [id number]  Patient presents for L Sided Pain (x3 days- L side pain that radiates to back- reports that she is also having some bleeding with her BM d/t hemorrhoids- denies abd pain/ cramping)  Left side pain and left flank pain started 3 days, no abdominal pain or cramping.no pain with urination, The pain is improving Noted some blood in stool has been straining with bowel movements,She has known hemorrhoids.  have BM this morning, no hard stools  No diarrhea , feels like they have been regular. 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    Review Of Systems:  GEN- denies fatigue, fever, weight loss,weakness, recent illness HEENT- denies eye drainage, change in vision, nasal discharge, CVS- denies chest pain, palpitations RESP- denies SOB, cough, wheeze ABD- denies N/V, change in stools, abd pain GU- denies dysuria, hematuria, dribbling, incontinence MSK- denies joint pain, muscle aches, injury Neuro- denies headache, dizziness, syncope, seizure activity       Objective:    BP (!) 112/58 (BP Location: Left Arm, Patient Position: Sitting, Cuff Size: Large)   Pulse 84   Temp 98.4 F (36.9 C) (Oral)   Resp 16   Ht 5\' 6"  (1.676 m)   Wt 257 lb (116.6 kg)   SpO2 100%   BMI 41.48 kg/m  GEN- NAD, alert and oriented x3 HEENT- PERRL, EOMI, non injected sclera, pink conjunctiva, MMM, oropharynx clear CVS- RRR, no murmur RESP-CTAB ABD-NABS,soft,NT,ND, no CVA tenderness  Pulses- Radial  2+        Assessment & Plan:      Problem List Items Addressed This Visit    Morbid obesity (HCC)    Other Visit Diagnoses    Left flank pain    -  Primary   Urinalysis is completely normal does not sound like typical kidney stone. With her having hemorrhoidal bleeding concerned that she may have more constipation Issues and gas may be  causing the pain. I do not have any red flags on exam. We'll have her take a laxative and see if this helps.    Relevant Orders   Urinalysis, Routine w reflex microscopic (Completed)   Hemorrhoids, unspecified hemorrhoid type       Hydrocortisone suppositories      Note: This dictation was prepared with Dragon dictation along with smaller phrase technology. Any transcriptional errors that result from this process are unintentional.

## 2017-01-24 ENCOUNTER — Encounter: Payer: Self-pay | Admitting: Family Medicine

## 2017-01-24 ENCOUNTER — Ambulatory Visit (INDEPENDENT_AMBULATORY_CARE_PROVIDER_SITE_OTHER): Admitting: Family Medicine

## 2017-01-24 VITALS — BP 112/62 | HR 80 | Temp 98.1°F | Resp 14 | Ht 66.0 in | Wt 256.0 lb

## 2017-01-24 DIAGNOSIS — Z0000 Encounter for general adult medical examination without abnormal findings: Secondary | ICD-10-CM

## 2017-01-24 DIAGNOSIS — Z1159 Encounter for screening for other viral diseases: Secondary | ICD-10-CM

## 2017-01-24 DIAGNOSIS — E782 Mixed hyperlipidemia: Secondary | ICD-10-CM | POA: Diagnosis not present

## 2017-01-24 DIAGNOSIS — E559 Vitamin D deficiency, unspecified: Secondary | ICD-10-CM

## 2017-01-24 DIAGNOSIS — Z1231 Encounter for screening mammogram for malignant neoplasm of breast: Secondary | ICD-10-CM

## 2017-01-24 DIAGNOSIS — Z1239 Encounter for other screening for malignant neoplasm of breast: Secondary | ICD-10-CM

## 2017-01-24 DIAGNOSIS — E119 Type 2 diabetes mellitus without complications: Secondary | ICD-10-CM | POA: Diagnosis not present

## 2017-01-24 LAB — CBC WITH DIFFERENTIAL/PLATELET
BASOS ABS: 0 {cells}/uL (ref 0–200)
BASOS PCT: 0 %
EOS PCT: 1 %
Eosinophils Absolute: 92 cells/uL (ref 15–500)
HCT: 39.6 % (ref 35.0–45.0)
HEMOGLOBIN: 12 g/dL (ref 12.0–15.0)
LYMPHS ABS: 3220 {cells}/uL (ref 850–3900)
LYMPHS PCT: 35 %
MCH: 22.2 pg — AB (ref 27.0–33.0)
MCHC: 30.3 g/dL — AB (ref 32.0–36.0)
MCV: 73.3 fL — ABNORMAL LOW (ref 80.0–100.0)
MONOS PCT: 5 %
Monocytes Absolute: 460 cells/uL (ref 200–950)
NEUTROS PCT: 59 %
Neutro Abs: 5428 cells/uL (ref 1500–7800)
PLATELETS: 170 10*3/uL (ref 140–400)
RBC: 5.4 MIL/uL — ABNORMAL HIGH (ref 3.80–5.10)
RDW: 18 % — AB (ref 11.0–15.0)
WBC: 9.2 10*3/uL (ref 3.8–10.8)

## 2017-01-24 LAB — COMPREHENSIVE METABOLIC PANEL
ALBUMIN: 3.8 g/dL (ref 3.6–5.1)
ALT: 12 U/L (ref 6–29)
AST: 12 U/L (ref 10–35)
Alkaline Phosphatase: 88 U/L (ref 33–130)
BILIRUBIN TOTAL: 0.3 mg/dL (ref 0.2–1.2)
BUN: 19 mg/dL (ref 7–25)
CALCIUM: 9.4 mg/dL (ref 8.6–10.4)
CO2: 25 mmol/L (ref 20–31)
Chloride: 107 mmol/L (ref 98–110)
Creat: 0.89 mg/dL (ref 0.50–1.05)
Glucose, Bld: 102 mg/dL — ABNORMAL HIGH (ref 70–99)
POTASSIUM: 4.8 mmol/L (ref 3.5–5.3)
SODIUM: 142 mmol/L (ref 135–146)
Total Protein: 6.9 g/dL (ref 6.1–8.1)

## 2017-01-24 LAB — LIPID PANEL
CHOL/HDL RATIO: 2.9 ratio (ref <5.0)
Cholesterol: 150 mg/dL (ref <200)
HDL: 52 mg/dL (ref >50)
LDL CALC: 87 mg/dL (ref <100)
TRIGLYCERIDES: 54 mg/dL (ref <150)
VLDL: 11 mg/dL (ref <30)

## 2017-01-24 LAB — HEPATITIS C ANTIBODY: HCV AB: NEGATIVE

## 2017-01-24 NOTE — Assessment & Plan Note (Signed)
Diet-controlled recheck A1c along with statin drug and renal function. Discussion with regarding low-carb foods healthy fats cutting out the sweets Discussed exercise program. I would like to see her down another 40 pounds with a make a significant improvement in her overall health.

## 2017-01-24 NOTE — Progress Notes (Signed)
   Subjective:    Patient ID: Angela Bates, female    DOB: 03/18/1961, 56 y.o.   MRN: [id number]  Patient presents for CPE (is fasting) Patient here for complete physical exam. Medications reviewed.   She is over due for labs for diabetes, hyperlipidemia. Though diet controlled ,last A1C 6.7% in Nov 2016.    Weight down 8 lbs since 2016.  She's been trying to cut out her sweets including her beverages. She is also trying to start a walking program. 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  Immunizations- UTD Colonoscopy- UTD Mammogram- Due, last in  2016 Discussed Hep C screening s/p hysterectomy no PAP needed    Review Of Systems:  GEN- denies fatigue, fever, weight loss,weakness, recent illness HEENT- denies eye drainage, change in vision, nasal discharge, CVS- denies chest pain, palpitations RESP- denies SOB, cough, wheeze ABD- denies N/V, change in stools, abd pain GU- denies dysuria, hematuria, dribbling, incontinence MSK- denies joint pain, muscle aches, injury Neuro- denies headache, dizziness, syncope, seizure activity       Objective:    BP 112/62   Pulse 80   Temp 98.1 F (36.7 C) (Oral)   Resp 14   Ht 5\' 6"  (1.676 m)   Wt 256 lb (116.1 kg)   SpO2 99%   BMI 41.32 kg/m  GEN- NAD, alert and oriented x3,Obese HEENT- PERRL, EOMI, non injected sclera, pink conjunctiva, MMM, oropharynx clear Neck- Supple, no thyromegaly CVS- RRR, no murmur RESP-CTAB ABD-NABS,soft,NT,ND EXT- No edema Pulses- Radial, DP- 2+        Assessment & Plan:      Problem List Items Addressed This Visit    Vitamin D deficiency   Relevant Orders   Vitamin D, 25-hydroxy   Morbid obesity (HCC)   Hyperlipidemia   Diabetes mellitus type II, controlled (HCC) - Primary    Diet-controlled recheck A1c along with statin drug and renal function. Discussion with regarding low-carb foods healthy fats cutting out the sweets Discussed  exercise program. I would like to see her down another 40 pounds with a make a significant improvement in her overall health.      Relevant Orders   Lipid panel   Hemoglobin A1c   Microalbumin / creatinine urine ratio    Other Visit Diagnoses    Routine general medical examination at a health care facility       CPE done, immunizations UTD, fasting labs, discussed mammogram, Hep C screening   Relevant Orders   CBC with Differential/Platelet   Comprehensive metabolic panel   Breast cancer screening       Encounter for hepatitis C screening test for low risk patient       Relevant Orders   Hepatitis C antibody      Note: This dictation was prepared with Dragon dictation along with smaller phrase technology. Any transcriptional errors that result from this process are unintentional.

## 2017-01-24 NOTE — Patient Instructions (Addendum)
Schedule your mammogram I recommend eye visit once a year I recommend dental visit every 6 months Goal is to  Exercise 30 minutes 5 days a week We will call with lab results  [facility]

## 2017-01-25 LAB — MICROALBUMIN / CREATININE URINE RATIO
Creatinine, Urine: 206 mg/dL (ref 20–320)
Microalb Creat Ratio: 1 mcg/mg creat (ref <30)
Microalb, Ur: 0.2 mg/dL

## 2017-01-25 LAB — HEMOGLOBIN A1C
Hgb A1c MFr Bld: 5.8 % — ABNORMAL HIGH (ref <5.7)
Mean Plasma Glucose: 120 mg/dL

## 2017-01-25 LAB — VITAMIN D 25 HYDROXY (VIT D DEFICIENCY, FRACTURES): VIT D 25 HYDROXY: 27 ng/mL — AB (ref 30–100)

## 2017-05-27 ENCOUNTER — Encounter: Payer: Self-pay | Admitting: Family Medicine

## 2017-05-27 ENCOUNTER — Ambulatory Visit (INDEPENDENT_AMBULATORY_CARE_PROVIDER_SITE_OTHER): Admitting: Family Medicine

## 2017-05-27 DIAGNOSIS — Z1231 Encounter for screening mammogram for malignant neoplasm of breast: Secondary | ICD-10-CM | POA: Diagnosis not present

## 2017-05-27 DIAGNOSIS — E119 Type 2 diabetes mellitus without complications: Secondary | ICD-10-CM | POA: Diagnosis not present

## 2017-05-27 DIAGNOSIS — Z1239 Encounter for other screening for malignant neoplasm of breast: Secondary | ICD-10-CM

## 2017-05-27 LAB — BASIC METABOLIC PANEL
BUN: 11 mg/dL (ref 7–25)
CALCIUM: 9.3 mg/dL (ref 8.6–10.4)
CHLORIDE: 103 mmol/L (ref 98–110)
CO2: 27 mmol/L (ref 20–31)
CREATININE: 1.06 mg/dL — AB (ref 0.50–1.05)
GLUCOSE: 94 mg/dL (ref 70–99)
Potassium: 5.1 mmol/L (ref 3.5–5.3)
Sodium: 140 mmol/L (ref 135–146)

## 2017-05-27 NOTE — Progress Notes (Signed)
   Subjective:    Patient ID: Angela Bates, female    DOB: 06/24/1961, 56 y.o.   MRN: [id number]  Patient presents for Follow-up (is fasting)  Patient here follow-up chronic medical problems. Medications reviewed. History of diabetes mellitus last A1c is 5.8% currently diet controlled. Lipids also at goal performed in March 2018 Obesity her weight is down 1 pound since her last visit in March  Overdue for mammogram  Review Of Systems:  GEN- denies fatigue, fever, weight loss,weakness, recent illness HEENT- denies eye drainage, change in vision, nasal discharge, CVS- denies chest pain, palpitations RESP- denies SOB, cough, wheeze ABD- denies N/V, change in stools, abd pain GU- denies dysuria, hematuria, dribbling, incontinence MSK- denies joint pain, muscle aches, injury Neuro- denies headache, dizziness, syncope, seizure activity       Objective:    BP 122/68   Pulse 84   Temp 98.2 F (36.8 C) (Oral)   Resp 14   Ht 5\' 6"  (1.676 m)   Wt 255 lb (115.7 kg)   SpO2 98%   BMI 41.16 kg/m  GEN- NAD, alert and oriented x3 HEENT- PERRL, EOMI, non injected sclera, pink conjunctiva, MMM, oropharynx clear Neck- Supple, no thyromegaly CVS- RRR, no murmur RESP-CTAB ABD-NABS,soft,NT,ND EXT- No edema Pulses- Radial, DP- 2+        Assessment & Plan:      Problem List Items Addressed This Visit    Morbid obesity (HCC) - Primary   Diabetes mellitus type II, controlled (HCC)    Currently diet-controlled. Still has significant problems with carbohydrate intake fried foods. No significant weight loss over the past few months. We'll recheck her A1c as well as her metabolic panel.  Discussions made for with regards to nutrition and healthy meal planning. Her ALT medical's to get to around 200 pounds.  She will also schedule her eye exam she was given the phone number for this as well as her mammogram      Relevant Orders   Hemoglobin A1c   Basic metabolic panel   HM DIABETES  FOOT EXAM (Completed)    Other Visit Diagnoses    Breast cancer screening       Relevant Orders   MM SCREENING BREAST TOMO BILATERAL      Note: This dictation was prepared with Dragon dictation along with smaller phrase technology. Any transcriptional errors that result from this process are unintentional.

## 2017-05-27 NOTE — Assessment & Plan Note (Signed)
Currently diet-controlled. Still has significant problems with carbohydrate intake fried foods. No significant weight loss over the past few months. We'll recheck her A1c as well as her metabolic panel.  Discussions made for with regards to nutrition and healthy meal planning. Her ALT medical's to get to around 200 pounds.  She will also schedule her eye exam she was given the phone number for this as well as her mammogram

## 2017-05-27 NOTE — Patient Instructions (Addendum)
Dr. Elmer PickerHecker [phone] Schedule your mammogram F/U 6 months

## 2017-05-28 LAB — HEMOGLOBIN A1C
Hgb A1c MFr Bld: 6 % — ABNORMAL HIGH (ref <5.7)
MEAN PLASMA GLUCOSE: 126 mg/dL

## 2017-06-06 ENCOUNTER — Ambulatory Visit (HOSPITAL_COMMUNITY)
Admission: RE | Admit: 2017-06-06 | Discharge: 2017-06-06 | Disposition: A | Discharge status: Home / Self Care | Source: Ambulatory Visit | Attending: Family Medicine | Admitting: Family Medicine

## 2017-06-06 DIAGNOSIS — Z1231 Encounter for screening mammogram for malignant neoplasm of breast: Secondary | ICD-10-CM | POA: Diagnosis present

## 2017-06-06 DIAGNOSIS — Z1239 Encounter for other screening for malignant neoplasm of breast: Secondary | ICD-10-CM

## 2017-08-15 LAB — HM DIABETES EYE EXAM

## 2017-08-16 ENCOUNTER — Encounter: Payer: Self-pay | Admitting: Family Medicine

## 2017-09-08 ENCOUNTER — Encounter: Payer: Self-pay | Admitting: Family Medicine

## 2017-11-28 ENCOUNTER — Ambulatory Visit: Admitting: Family Medicine

## 2017-11-29 ENCOUNTER — Encounter: Payer: Self-pay | Admitting: Family Medicine

## 2017-12-01 ENCOUNTER — Encounter: Payer: Self-pay | Admitting: Family Medicine

## 2018-01-11 ENCOUNTER — Encounter: Payer: Self-pay | Admitting: Family Medicine

## 2018-05-03 ENCOUNTER — Encounter: Admitting: Family Medicine

## 2018-05-09 ENCOUNTER — Encounter: Admitting: Family Medicine

## 2018-05-09 ENCOUNTER — Encounter: Payer: Self-pay | Admitting: Family Medicine

## 2018-05-16 ENCOUNTER — Encounter: Payer: Self-pay | Admitting: Family Medicine

## 2018-05-16 ENCOUNTER — Ambulatory Visit (INDEPENDENT_AMBULATORY_CARE_PROVIDER_SITE_OTHER): Admitting: Family Medicine

## 2018-05-16 ENCOUNTER — Other Ambulatory Visit: Payer: Self-pay

## 2018-05-16 VITALS — BP 120/80 | HR 75 | Temp 98.5°F | Resp 16 | Ht 66.0 in | Wt 260.0 lb

## 2018-05-16 DIAGNOSIS — Z0000 Encounter for general adult medical examination without abnormal findings: Secondary | ICD-10-CM

## 2018-05-16 DIAGNOSIS — E559 Vitamin D deficiency, unspecified: Secondary | ICD-10-CM | POA: Diagnosis not present

## 2018-05-16 DIAGNOSIS — Z6841 Body Mass Index (BMI) 40.0 and over, adult: Secondary | ICD-10-CM | POA: Diagnosis not present

## 2018-05-16 DIAGNOSIS — Z114 Encounter for screening for human immunodeficiency virus [HIV]: Secondary | ICD-10-CM

## 2018-05-16 DIAGNOSIS — Z1231 Encounter for screening mammogram for malignant neoplasm of breast: Secondary | ICD-10-CM | POA: Diagnosis not present

## 2018-05-16 DIAGNOSIS — E782 Mixed hyperlipidemia: Secondary | ICD-10-CM

## 2018-05-16 DIAGNOSIS — E119 Type 2 diabetes mellitus without complications: Secondary | ICD-10-CM | POA: Diagnosis not present

## 2018-05-16 DIAGNOSIS — M79602 Pain in left arm: Secondary | ICD-10-CM

## 2018-05-16 NOTE — Patient Instructions (Signed)

## 2018-05-16 NOTE — Progress Notes (Signed)
Patient: Angela Bates, Female    DOB: 05/23/1961, 57 y.o.   MRN: [id number] Visit Date: 05/16/2018  Today's Provider: Danelle BerryLeisa Mitul Hallowell, PA-C   Chief Complaint  Patient presents with  . Annual Exam    Patient has c/o of left arm soreness, and heaviness.   Subjective:    Annual physical exam Angela Bates is a 57 y.o. female who presents today for health maintenance and complete physical. She feels well. She reports exercising with walking at her job throughout the day. She reports she is sleeping well.  ----------------------------------------------------------------- RHD female Has left arm pain and heaviness and shooting pain from left elbow up to shoulder.  currently pain is mild described as heaviness and soreness, no swelling, weakness, numbness, denies fall or trauma.  Denies lifting or repetitive motions.  Thinks she slept on it wrong, pain was severe two weeks ago but got gradually better.  Pain is exacerbated with movement, such as flexing left arm straight up, no pain or worsening of pain with any other motions.  No decreased ROM.  She is doing stretches at her elbow and resting and it has improved.  She is doing yearly mammogram, due this month. Status post total hysterectomy, does not do Pap smears, and does not want any pelvic exam, not sexually active, asymptomatic, no concerns.  She only take supplements, including vitamin D and multivitamin, hx of vitamin D deficiency  Hx in chart of T2DM and HLD, pt states controlled by diet and exercise, but she has not been eating healthy and she is not exercising beyond what walking she does at school.  Weight is unchanged.  She would like to go to weightloss or nutrition counseling, but does not really want to take any pills or shots.  She is due for annual eye exam, does wear prescription lenses.  No change to her vision.  Review of Systems  Constitutional: Negative.  Negative for activity change, appetite change, fatigue and  unexpected weight change.  HENT: Negative.   Eyes: Negative.   Respiratory: Negative.  Negative for shortness of breath.   Cardiovascular: Negative.  Negative for chest pain, palpitations and leg swelling.  Gastrointestinal: Negative.  Negative for abdominal pain and blood in stool.  Endocrine: Negative.   Genitourinary: Negative.   Musculoskeletal: Negative.  Negative for arthralgias, gait problem, joint swelling and myalgias.  Skin: Negative.  Negative for color change, pallor and rash.  Allergic/Immunologic: Negative.   Neurological: Negative.  Negative for syncope and weakness.  Hematological: Negative.   Psychiatric/Behavioral: Negative.  Negative for confusion, dysphoric mood, self-injury and suicidal ideas. The patient is not nervous/anxious.     Social History      She  reports that she has quit smoking. She has never used smokeless tobacco. She reports that she does not drink alcohol or use drugs.          Social History   Socioeconomic History  . Marital status: Widowed    Spouse name: Not on file  . Number of children: Not on file  . Years of education: Not on file  . Highest education level: Not on file  Occupational History  . Not on file  Social Needs  . Financial resource strain: Not on file  . Food insecurity:    Worry: Not on file    Inability: Not on file  . Transportation needs:    Medical: Not on file    Non-medical: Not on file  Tobacco Use  . Smoking status: Former  Smoker  . Smokeless tobacco: Never Used  . Tobacco comment: quit >10 years prior  Substance and Sexual Activity  . Alcohol use: No  . Drug use: No  . Sexual activity: Never  Lifestyle  . Physical activity:    Days per week: Not on file    Minutes per session: Not on file  . Stress: Not on file  Relationships  . Social connections:    Talks on phone: Not on file    Gets together: Not on file    Attends religious service: Not on file    Active member of club or organization: Not  on file    Attends meetings of clubs or organizations: Not on file    Relationship status: Not on file  Other Topics Concern  . Not on file  Social History Narrative  . Not on file    Past Medical History:  Diagnosis Date  . Diabetes mellitus without complication (HCC)   . Hyperlipidemia      Patient Active Problem List   Diagnosis Date Noted  . Hyperlipidemia 03/10/2015  . Lumbago 10/28/2014  . Stress and adjustment reaction 10/28/2014  . Pain in joint, ankle and foot 02/11/2014  . Bunion of great toe 02/11/2014  . Diabetes mellitus type II, controlled (HCC) 11/26/2013  . Vitamin D deficiency 11/26/2013  . Morbid obesity (HCC) 11/26/2013    Past Surgical History:  Procedure Laterality Date  . ABDOMINAL HYSTERECTOMY  2002    Family History        Family Status  Relation Name Status  . Father  Deceased at age 45  . Mother  Alive  . Sister  Alive  . Brother  Alive  . MGM  Deceased  . MGF  Deceased  . PGM  Deceased  . PGF  Deceased  . Sister  Alive  . Brother  Alive  . Son  Alive  . Son  Alive  . Son  Alive        Her family history includes Cancer in her brother and father; Hypertension in her mother.      No Known Allergies   Current Outpatient Medications:  .  Cholecalciferol (VITAMIN D3) 1000 units CAPS, Take by mouth., Disp: , Rfl:  .  Multiple Vitamin (MULTIVITAMIN) capsule, Take 1 capsule by mouth daily., Disp: , Rfl:    Patient Care Team: Salley Scarlet, MD as PCP - General (Family Medicine)      Objective:   Vitals: BP 120/80   Pulse 75   Temp 98.5 F (36.9 C) (Oral)   Resp 16   Ht 5\' 6"  (1.676 m)   Wt 260 lb (117.9 kg)   SpO2 97%   BMI 41.97 kg/m    Vitals:   05/16/18 1509  BP: 120/80  Pulse: 75  Resp: 16  Temp: 98.5 F (36.9 C)  TempSrc: Oral  SpO2: 97%  Weight: 260 lb (117.9 kg)  Height: 5\' 6"  (1.676 m)     Physical Exam  Constitutional: She is oriented to person, place, and time. She appears well-developed and  well-nourished.  Non-toxic appearance. No distress.  Obese female, well-appearing, appears stated age  HENT:  Head: Normocephalic and atraumatic.  Right Ear: External ear normal.  Left Ear: External ear normal.  Nose: Nose normal.  Mouth/Throat: Uvula is midline, oropharynx is clear and moist and mucous membranes are normal. No oropharyngeal exudate.  Eyes: Pupils are equal, round, and reactive to light. Conjunctivae, EOM and lids are normal.  Neck: Normal range of motion and phonation normal. Neck supple. No tracheal deviation present. No thyromegaly present.  Cardiovascular: Normal rate, regular rhythm, normal heart sounds and normal pulses. Exam reveals no gallop and no friction rub.  No murmur heard. Pulses:      Radial pulses are 2+ on the right side, and 2+ on the left side.       Posterior tibial pulses are 2+ on the right side, and 2+ on the left side.  Pulmonary/Chest: Effort normal and breath sounds normal. No stridor. No respiratory distress. She has no wheezes. She has no rhonchi. She has no rales. She exhibits no tenderness.  Abdominal: Soft. Normal appearance and bowel sounds are normal. She exhibits no distension. There is no tenderness. There is no rebound and no guarding.  Musculoskeletal: Normal range of motion. She exhibits no edema, tenderness or deformity.       Left shoulder: She exhibits crepitus. She exhibits normal range of motion, no tenderness, no bony tenderness, no swelling, no effusion, no deformity, no laceration, no pain, no spasm, normal pulse and normal strength.       Left elbow: Normal. She exhibits normal range of motion, no swelling, no effusion, no deformity and no laceration. No tenderness found.  Lymphadenopathy:    She has no cervical adenopathy.  Neurological: She is alert and oriented to person, place, and time. No cranial nerve deficit or sensory deficit. She exhibits normal muscle tone. Coordination and gait normal.  Skin: Skin is warm, dry and  intact. Capillary refill takes less than 2 seconds. No rash noted. She is not diaphoretic. No pallor.  Psychiatric: She has a normal mood and affect. Her speech is normal and behavior is normal. Judgment and thought content normal.  Nursing note and vitals reviewed.    Depression Screen PHQ 2/9 Scores 05/16/2018 05/27/2017 01/24/2017 05/05/2015  PHQ - 2 Score 0 0 0 0  PHQ- 9 Score - 0 0 1      Assessment & Plan:     Routine Health Maintenance and Physical Exam  Exercise Activities and Dietary recommendations Goals    . Weight (lb) < 250 lb (113.4 kg)     To loose 3-5%        Immunization History  Administered Date(s) Administered  . Influenza,inj,Quad PF,6+ Mos 11/26/2013, 10/28/2014, 09/10/2015  . PPD Test 05/05/2015  . Pneumococcal Polysaccharide-23 02/25/2014  . Tdap 11/26/2013    Health Maintenance  Topic Date Due  . HIV Screening  05/08/1976  . HEMOGLOBIN A1C  11/27/2017  . URINE MICROALBUMIN  01/24/2018  . FOOT EXAM  05/27/2018  . INFLUENZA VACCINE  06/08/2018  . OPHTHALMOLOGY EXAM  08/15/2018  . PNEUMOCOCCAL POLYSACCHARIDE VACCINE (2) 02/26/2019  . MAMMOGRAM  06/07/2019  . COLONOSCOPY  08/05/2021  . TETANUS/TDAP  11/27/2023  . Hepatitis C Screening  Completed     Discussed health benefits of physical activity, and encouraged her to engage in regular exercise appropriate for her age and condition.    --------------------------------------------------------------------  Problem List Items Addressed This Visit      Endocrine   Diabetes mellitus type II, controlled (HCC)   Relevant Orders   Hemoglobin A1c   Microalbumin, urine     Other   Vitamin D deficiency   Relevant Orders   VITAMIN D 25 Hydroxy (Vit-D Deficiency, Fractures)   Morbid obesity (HCC)   Hyperlipidemia    Other Visit Diagnoses    General medical exam    -  Primary   Relevant  Orders   CBC with Differential/Platelet   Lipid panel   Hemoglobin A1c   TSH   Microalbumin, urine    COMPLETE METABOLIC PANEL WITH GFR   HIV antibody   Breast cancer screening by mammogram       Last mammogram reviewed, 06/06/2017, indication for yearly screening, ordered   Relevant Orders   MM Digital Screening   Screening for HIV (human immunodeficiency virus)       Relevant Orders   HIV antibody   Left arm pain       onset 2 weeka ago upon waking, improving with stretching, normal exam, OTC NSAIDs, rest, ice, r/up as needed     Patient not fasting will return for fasting lab work.  Danelle Berry, PA-C 05/16/18 5:46 PM  [facility]

## 2018-05-19 ENCOUNTER — Other Ambulatory Visit

## 2018-05-19 DIAGNOSIS — Z114 Encounter for screening for human immunodeficiency virus [HIV]: Secondary | ICD-10-CM

## 2018-05-19 DIAGNOSIS — E559 Vitamin D deficiency, unspecified: Secondary | ICD-10-CM

## 2018-05-19 DIAGNOSIS — E119 Type 2 diabetes mellitus without complications: Secondary | ICD-10-CM

## 2018-05-19 DIAGNOSIS — Z0000 Encounter for general adult medical examination without abnormal findings: Secondary | ICD-10-CM

## 2018-05-19 DIAGNOSIS — Z6841 Body Mass Index (BMI) 40.0 and over, adult: Secondary | ICD-10-CM

## 2018-05-20 LAB — COMPLETE METABOLIC PANEL WITH GFR
AG Ratio: 1.3 (calc) (ref 1.0–2.5)
ALKALINE PHOSPHATASE (APISO): 104 U/L (ref 33–130)
ALT: 13 U/L (ref 6–29)
AST: 13 U/L (ref 10–35)
Albumin: 3.9 g/dL (ref 3.6–5.1)
BILIRUBIN TOTAL: 0.6 mg/dL (ref 0.2–1.2)
BUN/Creatinine Ratio: 12 (calc) (ref 6–22)
BUN: 13 mg/dL (ref 7–25)
CHLORIDE: 105 mmol/L (ref 98–110)
CO2: 28 mmol/L (ref 20–32)
CREATININE: 1.08 mg/dL — AB (ref 0.50–1.05)
Calcium: 9.1 mg/dL (ref 8.6–10.4)
GFR, Est African American: 66 mL/min/{1.73_m2} (ref >=60)
GFR, Est Non African American: 57 mL/min/{1.73_m2} — ABNORMAL LOW (ref >=60)
GLUCOSE: 112 mg/dL — AB (ref 65–99)
Globulin: 3 g/dL (calc) (ref 1.9–3.7)
Potassium: 4.7 mmol/L (ref 3.5–5.3)
Sodium: 139 mmol/L (ref 135–146)
Total Protein: 6.9 g/dL (ref 6.1–8.1)

## 2018-05-20 LAB — CBC WITH DIFFERENTIAL/PLATELET
BASOS ABS: 40 {cells}/uL (ref 0–200)
BASOS PCT: 0.4 %
EOS ABS: 101 {cells}/uL (ref 15–500)
EOS PCT: 1 %
HCT: 39.2 % (ref 35.0–45.0)
Hemoglobin: 12.3 g/dL (ref 11.7–15.5)
LYMPHS ABS: 3575 {cells}/uL (ref 850–3900)
MCH: 22.5 pg — ABNORMAL LOW (ref 27.0–33.0)
MCHC: 31.4 g/dL — ABNORMAL LOW (ref 32.0–36.0)
MCV: 71.7 fL — AB (ref 80.0–100.0)
Monocytes Relative: 5.4 %
NEUTROS PCT: 57.8 %
Neutro Abs: 5838 cells/uL (ref 1500–7800)
PLATELETS: 155 10*3/uL (ref 140–400)
RBC: 5.47 10*6/uL — AB (ref 3.80–5.10)
RDW: 16.8 % — ABNORMAL HIGH (ref 11.0–15.0)
Total Lymphocyte: 35.4 %
WBC mixed population: 545 cells/uL (ref 200–950)
WBC: 10.1 10*3/uL (ref 3.8–10.8)

## 2018-05-20 LAB — TSH: TSH: 2.34 m[IU]/L (ref 0.40–4.50)

## 2018-05-20 LAB — HEMOGLOBIN A1C
EAG (MMOL/L): 7 (calc)
Hgb A1c MFr Bld: 6 % of total Hgb — ABNORMAL HIGH (ref <5.7)
MEAN PLASMA GLUCOSE: 126 (calc)

## 2018-05-20 LAB — VITAMIN D 25 HYDROXY (VIT D DEFICIENCY, FRACTURES): VIT D 25 HYDROXY: 31 ng/mL (ref 30–100)

## 2018-05-20 LAB — LIPID PANEL
CHOLESTEROL: 165 mg/dL (ref <200)
HDL: 50 mg/dL — AB (ref >50)
LDL Cholesterol (Calc): 98 mg/dL (calc)
NON-HDL CHOLESTEROL (CALC): 115 mg/dL (ref <130)
Total CHOL/HDL Ratio: 3.3 (calc) (ref <5.0)
Triglycerides: 78 mg/dL (ref <150)

## 2018-05-20 LAB — HIV ANTIBODY (ROUTINE TESTING W REFLEX): HIV 1&2 Ab, 4th Generation: NONREACTIVE

## 2018-05-20 LAB — MICROALBUMIN, URINE: Microalb, Ur: 0.2 mg/dL

## 2018-05-22 ENCOUNTER — Other Ambulatory Visit

## 2018-05-22 NOTE — Progress Notes (Signed)
Pts routine labs done, please call pt with results:  Blood levels at baseline (no anemia) Cholesterol, liver look good. Kidney function at baseline compared to last years labs Thyroid normal. Vitamin D is in normal range, (low end of normal) so if she is still supplementing Vit D, I would continue current tx Blood sugar continues to be mildly elevated in Prediabetes range, also unchanged from last year.

## 2018-08-07 ENCOUNTER — Ambulatory Visit (HOSPITAL_COMMUNITY)
Admission: RE | Admit: 2018-08-07 | Discharge: 2018-08-07 | Disposition: A | Discharge status: Home / Self Care | Source: Ambulatory Visit | Attending: Family Medicine | Admitting: Family Medicine

## 2018-08-07 ENCOUNTER — Ambulatory Visit (HOSPITAL_COMMUNITY)

## 2018-08-07 DIAGNOSIS — Z1231 Encounter for screening mammogram for malignant neoplasm of breast: Secondary | ICD-10-CM | POA: Insufficient documentation

## 2018-11-07 LAB — HM DIABETES EYE EXAM

## 2018-11-09 ENCOUNTER — Encounter: Payer: Self-pay | Admitting: *Deleted

## 2019-05-22 ENCOUNTER — Ambulatory Visit (INDEPENDENT_AMBULATORY_CARE_PROVIDER_SITE_OTHER): Admitting: Family Medicine

## 2019-05-22 ENCOUNTER — Other Ambulatory Visit: Payer: Self-pay

## 2019-05-22 ENCOUNTER — Encounter: Payer: Self-pay | Admitting: Family Medicine

## 2019-05-22 VITALS — BP 138/78 | HR 70 | Temp 98.7°F | Resp 18 | Ht 66.0 in | Wt 265.8 lb

## 2019-05-22 DIAGNOSIS — Z0001 Encounter for general adult medical examination with abnormal findings: Secondary | ICD-10-CM

## 2019-05-22 DIAGNOSIS — Z6841 Body Mass Index (BMI) 40.0 and over, adult: Secondary | ICD-10-CM

## 2019-05-22 DIAGNOSIS — E119 Type 2 diabetes mellitus without complications: Secondary | ICD-10-CM

## 2019-05-22 DIAGNOSIS — E782 Mixed hyperlipidemia: Secondary | ICD-10-CM

## 2019-05-22 DIAGNOSIS — E559 Vitamin D deficiency, unspecified: Secondary | ICD-10-CM

## 2019-05-22 DIAGNOSIS — Z0000 Encounter for general adult medical examination without abnormal findings: Secondary | ICD-10-CM

## 2019-05-22 NOTE — Patient Instructions (Addendum)
[facility] (with [facility]) - [place], [place], transferring there in August, you can transfer there if you want, or establish here with Dr. Buelah Manis or Dr. Dennard Schaumann  Follow up in one year for your next annual physical  If any labs are abnormal we will call you and may arrange a sooner follow up   Calcium 1200 mg a day, Vit D 1000 IU  -   Take in divided doses, like twice a day  Calculate your daily calories that you burn (basal metabolic rate) and start to track your calories.  Also track what calories you burn and see daily and weekly and if you are overall negative you will loose weight.  Negative 500 calories a day will loose weight, and health benefits come with only loosing 3-5% of your weight, which right now is only 7-13 lbs.     Health Maintenance, Female Adopting a healthy lifestyle and getting preventive care are important in promoting health and wellness. Ask your health care provider about:  The right schedule for you to have regular tests and exams.  Things you can do on your own to prevent diseases and keep yourself healthy. What should I know about diet, weight, and exercise? Eat a healthy diet   Eat a diet that includes plenty of vegetables, fruits, low-fat dairy products, and lean protein.  Do not eat a lot of foods that are high in solid fats, added sugars, or sodium. Maintain a healthy weight Body mass index (BMI) is used to identify weight problems. It estimates body fat based on height and weight. Your health care provider can help determine your BMI and help you achieve or maintain a healthy weight. Get regular exercise Get regular exercise. This is one of the most important things you can do for your health. Most adults should:  Exercise for at least 150 minutes each week. The exercise should increase your heart rate and make you sweat (moderate-intensity exercise).  Do strengthening exercises at least twice a week. This is in addition to the  moderate-intensity exercise.  Spend less time sitting. Even light physical activity can be beneficial. Watch cholesterol and blood lipids Have your blood tested for lipids and cholesterol at 58 years of age, then have this test every 5 years. Have your cholesterol levels checked more often if:  Your lipid or cholesterol levels are high.  You are older than 58 years of age.  You are at high risk for heart disease. What should I know about cancer screening? Depending on your health history and family history, you may need to have cancer screening at various ages. This may include screening for:  Breast cancer.  Cervical cancer.  Colorectal cancer.  Skin cancer.  Lung cancer. What should I know about heart disease, diabetes, and high blood pressure? Blood pressure and heart disease  High blood pressure causes heart disease and increases the risk of stroke. This is more likely to develop in people who have high blood pressure readings, are of African descent, or are overweight.  Have your blood pressure checked: ? Every 3-5 years if you are 1-39 years of age. ? Every year if you are 65 years old or older. Diabetes Have regular diabetes screenings. This checks your fasting blood sugar level. Have the screening done:  Once every three years after age 45 if you are at a normal weight and have a low risk for diabetes.  More often and at a younger age if you are overweight or have  a high risk for diabetes. What should I know about preventing infection? Hepatitis B If you have a higher risk for hepatitis B, you should be screened for this virus. Talk with your health care provider to find out if you are at risk for hepatitis B infection. Hepatitis C Testing is recommended for:  Everyone born from [address]1945 through 1965.  Anyone with known risk factors for hepatitis C. Sexually transmitted infections (STIs)  Get screened for STIs, including gonorrhea and chlamydia, if: ? You are  sexually active and are younger than 58 years of age. ? You are older than 58 years of age and your health care provider tells you that you are at risk for this type of infection. ? Your sexual activity has changed since you were last screened, and you are at increased risk for chlamydia or gonorrhea. Ask your health care provider if you are at risk.  Ask your health care provider about whether you are at high risk for HIV. Your health care provider may recommend a prescription medicine to help prevent HIV infection. If you choose to take medicine to prevent HIV, you should first get tested for HIV. You should then be tested every 3 months for as long as you are taking the medicine. Pregnancy  If you are about to stop having your period (premenopausal) and you may become pregnant, seek counseling before you get pregnant.  Take 400 to 800 micrograms (mcg) of folic acid every day if you become pregnant.  Ask for birth control (contraception) if you want to prevent pregnancy. Osteoporosis and menopause Osteoporosis is a disease in which the bones lose minerals and strength with aging. This can result in bone fractures. If you are 58 years old or older, or if you are at risk for osteoporosis and fractures, ask your health care provider if you should:  Be screened for bone loss.  Take a calcium or vitamin D supplement to lower your risk of fractures.  Be given hormone replacement therapy (HRT) to treat symptoms of menopause. Follow these instructions at home: Lifestyle  Do not use any products that contain nicotine or tobacco, such as cigarettes, e-cigarettes, and chewing tobacco. If you need help quitting, ask your health care provider.  Do not use street drugs.  Do not share needles.  Ask your health care provider for help if you need support or information about quitting drugs. Alcohol use  Do not drink alcohol if: ? Your health care provider tells you not to drink. ? You are  pregnant, may be pregnant, or are planning to become pregnant.  If you drink alcohol: ? Limit how much you use to 0-1 drink a day. ? Limit intake if you are breastfeeding.  Be aware of how much alcohol is in your drink. In the [place] one drink equals one 12 oz bottle of beer (355 mL), one 5 oz glass of wine (148 mL), or one 1 oz glass of hard liquor (44 mL). General instructions  Schedule regular health, dental, and eye exams.  Stay current with your vaccines.  Tell your health care provider if: ? You often feel depressed. ? You have ever been abused or do not feel safe at home. Summary  Adopting a healthy lifestyle and getting preventive care are important in promoting health and wellness.  Follow your health care provider's instructions about healthy diet, exercising, and getting tested or screened for diseases.  Follow your health care provider's instructions on monitoring your cholesterol and blood pressure. This  information is not intended to replace advice given to you by your health care provider. Make sure you discuss any questions you have with your health care provider. Document Released: 05/10/2011 Document Revised: 10/18/2018 Document Reviewed: 10/18/2018 Elsevier Patient Education  2020 [employer]    American Heart Association [facility](AHA) Exercise Recommendation  Being physically active is important to prevent heart disease and stroke, the nation's No. 1and No. 5killers. To improve overall cardiovascular health, we suggest at least 150 minutes per week of moderate exercise or 75 minutes per week of vigorous exercise (or a combination of moderate and vigorous activity). Thirty minutes a day, five times a week is an easy goal to remember. You will also experience benefits even if you divide your time into two or three segments of 10 to 15 minutes per day.  For people who would benefit from lowering their blood pressure or cholesterol, we recommend 40 minutes of aerobic  exercise of moderate to vigorous intensity three to four times a week to lower the risk for heart attack and stroke.  Physical activity is anything that makes you move your body and burn calories.  This includes things like climbing stairs or playing sports. Aerobic exercises benefit your heart, and include walking, jogging, swimming or biking. Strength and stretching exercises are best for overall stamina and flexibility.  The simplest, positive change you can make to effectively improve your heart health is to start walking. It's enjoyable, free, easy, social and great exercise. A walking program is flexible and boasts high success rates because people can stick with it. It's easy for walking to become a regular and satisfying part of life.   For Overall Cardiovascular Health:  At least 30 minutes of moderate-intensity aerobic activity at least 5 days per week for a total of 150  OR   At least 25 minutes of vigorous aerobic activity at least 3 days per week for a total of 75 minutes; or a combination of moderate- and vigorous-intensity aerobic activity  AND   Moderate- to high-intensity muscle-strengthening activity at least 2 days per week for additional health benefits.  For Lowering Blood Pressure and Cholesterol  An average 40 minutes of moderate- to vigorous-intensity aerobic activity 3 or 4 times per week  What if I can't make it to the time goal? Something is always better than nothing! And everyone has to start somewhere. Even if you've been sedentary for years, today is the day you can begin to make healthy changes in your life. If you don't think you'll make it for 30 or 40 minutes, set a reachable goal for today. You can work up toward your overall goal by increasing your time as you get stronger. Don't let all-or-nothing thinking rob you of doing what you can every day.  Source:http://www.heart.org

## 2019-05-22 NOTE — Progress Notes (Signed)
Patient: Angela Bates, Female    DOB: 08/14/1961, 58 y.o.   MRN: [id number] Visit Date: 05/22/2019   Today's Provider: Danelle BerryLeisa Lynn Sissel, PA-C   Chief Complaint  Patient presents with  . Annual Exam   Subjective:    Annual physical exam  Angela Bates is a 58 y.o. female who presents today for health maintenance and complete physical. She feels well. She reports not exercising. She reports she is sleeping fairly well.  -----------------------------------------------------------------  Pt is not on any medications, chart has hx of DM and HLD.  Reviewing past labs A1C has been 5.8-6.0, diet controlled.  And no recent HLD.  She is still having some trouble with her weight and has decreased the amount shes exercising, upset she gained a few lbs. She will return for fasting labs  Due for mammogram S/p hysterectomy, declines pelvic exam today.     Review of Systems  Constitutional: Negative.  Negative for activity change, appetite change, fatigue and unexpected weight change.  HENT: Negative.   Eyes: Negative.   Respiratory: Negative.  Negative for shortness of breath.   Cardiovascular: Negative.  Negative for chest pain, palpitations and leg swelling.  Gastrointestinal: Negative.  Negative for abdominal pain and blood in stool.  Endocrine: Negative.   Genitourinary: Negative.   Musculoskeletal: Negative for arthralgias, gait problem, joint swelling and myalgias.  Skin: Negative.  Negative for color change, pallor and rash.  Allergic/Immunologic: Negative.   Neurological: Negative.  Negative for syncope and weakness.  Hematological: Negative.   Psychiatric/Behavioral: Negative.  Negative for confusion, dysphoric mood, self-injury and suicidal ideas. The patient is not nervous/anxious.     Social History      She  reports that she has quit smoking. She has never used smokeless tobacco. She reports that she does not drink alcohol or use drugs.       Social History    Socioeconomic History  . Marital status: Widowed    Spouse name: Not on file  . Number of children: Not on file  . Years of education: Not on file  . Highest education level: Not on file  Occupational History  . Not on file  Social Needs  . Financial resource strain: Not on file  . Food insecurity    Worry: Not on file    Inability: Not on file  . Transportation needs    Medical: Not on file    Non-medical: Not on file  Tobacco Use  . Smoking status: Former Games developermoker  . Smokeless tobacco: Never Used  . Tobacco comment: quit >10 years prior  Substance and Sexual Activity  . Alcohol use: No  . Drug use: No  . Sexual activity: Never  Lifestyle  . Physical activity    Days per week: Not on file    Minutes per session: Not on file  . Stress: Not on file  Relationships  . Social Musicianconnections    Talks on phone: Not on file    Gets together: Not on file    Attends religious service: Not on file    Active member of club or organization: Not on file    Attends meetings of clubs or organizations: Not on file    Relationship status: Not on file  Other Topics Concern  . Not on file  Social History Narrative  . Not on file    Past Medical History:  Diagnosis Date  . Diabetes mellitus without complication (HCC)   . Hyperlipidemia      Patient  Active Problem List   Diagnosis Date Noted  . Hyperlipidemia 03/10/2015  . Lumbago 10/28/2014  . Stress and adjustment reaction 10/28/2014  . Pain in joint, ankle and foot 02/11/2014  . Bunion of great toe 02/11/2014  . Diabetes mellitus type II, controlled (HCC) 11/26/2013  . Vitamin D deficiency 11/26/2013  . Morbid obesity (HCC) 11/26/2013    Past Surgical History:  Procedure Laterality Date  . ABDOMINAL HYSTERECTOMY  2002    Family History        Family Status  Relation Name Status  . Father  Deceased at age 146  . Mother  Alive  . Sister  Alive  . Brother  Alive  . MGM  Deceased  . MGF  Deceased  . PGM  Deceased  .  PGF  Deceased  . Sister  Alive  . Brother  Alive  . Son  Alive  . Son  Alive  . Son  Alive        Her family history includes Cancer in her brother and father; Hypertension in her mother.      No Known Allergies   Current Outpatient Medications:  .  Cholecalciferol (VITAMIN D3) 1000 units CAPS, Take by mouth., Disp: , Rfl:  .  Multiple Vitamin (MULTIVITAMIN) capsule, Take 1 capsule by mouth daily., Disp: , Rfl:    Patient Care Team: Salley Scarleturham, Kawanta F, MD as PCP - General (Family Medicine)      Objective:   Vitals: BP 138/78   Pulse 70   Temp 98.7 F (37.1 C)   Resp 18   Ht 5\' 6"  (1.676 m)   Wt 265 lb 12.8 oz (120.6 kg)   SpO2 95%   BMI 42.90 kg/m    Vitals:   05/22/19 1029  BP: 138/78  Pulse: 70  Resp: 18  Temp: 98.7 F (37.1 C)  SpO2: 95%  Weight: 265 lb 12.8 oz (120.6 kg)  Height: 5\' 6"  (1.676 m)     Physical Exam Vitals signs and nursing note reviewed.  Constitutional:      General: She is not in acute distress.    Appearance: Normal appearance. She is well-developed. She is obese. She is not ill-appearing, toxic-appearing or diaphoretic.  HENT:     Head: Normocephalic and atraumatic.     Right Ear: External ear normal.     Left Ear: External ear normal.     Nose: Nose normal.     Mouth/Throat:     Mouth: Mucous membranes are moist.     Pharynx: Oropharynx is clear. Uvula midline.  Eyes:     General: Lids are normal. No scleral icterus.    Conjunctiva/sclera: Conjunctivae normal.     Pupils: Pupils are equal, round, and reactive to light.  Neck:     Musculoskeletal: Normal range of motion and neck supple.     Thyroid: No thyroid mass, thyromegaly or thyroid tenderness.     Trachea: Phonation normal. No tracheal deviation.  Cardiovascular:     Rate and Rhythm: Normal rate and regular rhythm.     Pulses: Normal pulses.          Radial pulses are 2+ on the right side and 2+ on the left side.       Posterior tibial pulses are 2+ on the right side  and 2+ on the left side.     Heart sounds: Normal heart sounds. No murmur. No friction rub. No gallop.   Pulmonary:     Effort: Pulmonary effort is  normal. No respiratory distress.     Breath sounds: Normal breath sounds. No stridor. No wheezing, rhonchi or rales.  Chest:     Chest wall: No tenderness.  Abdominal:     General: Bowel sounds are normal. There is no distension.     Palpations: Abdomen is soft.     Tenderness: There is no abdominal tenderness. There is no guarding or rebound.  Musculoskeletal: Normal range of motion.        General: No deformity.  Lymphadenopathy:     Cervical: No cervical adenopathy.  Skin:    General: Skin is warm and dry.     Capillary Refill: Capillary refill takes less than 2 seconds.     Coloration: Skin is not jaundiced or pale.     Findings: No bruising or rash.  Neurological:     Mental Status: She is alert and oriented to person, place, and time.     Motor: No weakness or abnormal muscle tone.     Gait: Gait normal.  Psychiatric:        Mood and Affect: Mood normal.        Speech: Speech normal.        Behavior: Behavior normal.        Thought Content: Thought content normal.        Judgment: Judgment normal.      Depression Screen PHQ 2/9 Scores 05/22/2019 05/16/2018 05/27/2017 01/24/2017  PHQ - 2 Score 0 0 0 0  PHQ- 9 Score - - 0 0     [facility]  AUDIT-C Score  0         Assessment & Plan:     Routine Health Maintenance and Physical Exam  Exercise Activities and Dietary recommendations Goals    . Weight (lb) < 250 lb (113.4 kg)     To loose 3-5%       Discussed health benefits of physical activity, and encouraged her to engage in regular exercise appropriate for her age and condition.   Immunization History  Administered Date(s) Administered  . Influenza,inj,Quad PF,6+ Mos 11/26/2013, 10/28/2014, 09/10/2015  . PPD Test 05/05/2015  . Pneumococcal Polysaccharide-23  02/25/2014  . Tdap 11/26/2013    Health Maintenance  Topic Date Due  . FOOT EXAM  05/27/2018  . HEMOGLOBIN A1C  11/19/2018  . URINE MICROALBUMIN  05/20/2019  . INFLUENZA VACCINE  06/09/2019  . MAMMOGRAM  08/08/2019  . OPHTHALMOLOGY EXAM  11/08/2019  . COLONOSCOPY  08/05/2021  . TETANUS/TDAP  11/27/2023  . PNEUMOCOCCAL POLYSACCHARIDE VACCINE AGE 3-64 HIGH RISK  Completed  . Hepatitis C Screening  Completed  . HIV Screening  Completed    Problem List Items Addressed This Visit      Endocrine   Diabetes mellitus type II, controlled (HCC)    Hx of T2DM, controlled with diet Recheck BMP/A1C      Relevant Orders   Hemoglobin A1c     Other   Vitamin D deficiency    Hx of Vit D deficiency, supplementing with OTC Vit D Recheck labs Postmenopausal pt encouraged Ca/Vit supplement at 1200mg /1000IU in divided doses.      Relevant Orders   VITAMIN D 25 Hydroxy (Vit-D Deficiency, Fractures) (Completed)   Hyperlipidemia    Hx of HLD in problem list, reviewing labs I see only mild abnormality in FLP - low HDL Will recheck fasting lipids and CMP      Relevant Orders   Lipid Panel (Completed)  Other Visit Diagnoses    General medical exam    -  Primary   CPE done   Relevant Orders   COMPLETE METABOLIC PANEL WITH GFR (Completed)   Hemoglobin A1c   TSH (Completed)   VITAMIN D 25 Hydroxy (Vit-D Deficiency, Fractures) (Completed)   CBC with Differential (Completed)   Lipid Panel (Completed)   Morbid obesity with BMI of 40.0-44.9, adult (HCC)       diet, exercise as able, calorie reduction and tracking, small goals of 3-5% weight loss at a time to improve overall health - handout given       Delsa Grana, PA-C 05/22/19 10:54 AM  [facility]

## 2019-05-23 ENCOUNTER — Encounter: Payer: Self-pay | Admitting: Family Medicine

## 2019-05-23 NOTE — Assessment & Plan Note (Signed)
Hx of T2DM, controlled with diet Recheck BMP/A1C

## 2019-05-23 NOTE — Assessment & Plan Note (Signed)
Hx of HLD in problem list, reviewing labs I see only mild abnormality in FLP - low HDL Will recheck fasting lipids and CMP

## 2019-05-23 NOTE — Assessment & Plan Note (Signed)
Hx of Vit D deficiency, supplementing with OTC Vit D Recheck labs Postmenopausal pt encouraged Ca/Vit supplement at 1200mg /1000IU in divided doses.

## 2019-05-24 ENCOUNTER — Other Ambulatory Visit: Payer: Self-pay | Admitting: Family Medicine

## 2019-05-24 LAB — COMPLETE METABOLIC PANEL WITH GFR
AG Ratio: 1.3 (calc) (ref 1.0–2.5)
ALT: 11 U/L (ref 6–29)
AST: 14 U/L (ref 10–35)
Albumin: 3.9 g/dL (ref 3.6–5.1)
Alkaline phosphatase (APISO): 106 U/L (ref 37–153)
BUN: 10 mg/dL (ref 7–25)
CO2: 28 mmol/L (ref 20–32)
Calcium: 9.3 mg/dL (ref 8.6–10.4)
Chloride: 107 mmol/L (ref 98–110)
Creat: 1.05 mg/dL (ref 0.50–1.05)
GFR, Est African American: 68 mL/min/{1.73_m2} (ref >=60)
GFR, Est Non African American: 58 mL/min/{1.73_m2} — ABNORMAL LOW (ref >=60)
Globulin: 3.1 g/dL (calc) (ref 1.9–3.7)
Glucose, Bld: 92 mg/dL (ref 65–99)
Potassium: 5.1 mmol/L (ref 3.5–5.3)
Sodium: 142 mmol/L (ref 135–146)
Total Bilirubin: 0.5 mg/dL (ref 0.2–1.2)
Total Protein: 7 g/dL (ref 6.1–8.1)

## 2019-05-24 LAB — CBC WITH DIFFERENTIAL/PLATELET
Absolute Monocytes: 601 cells/uL (ref 200–950)
Basophils Absolute: 27 cells/uL (ref 0–200)
Basophils Relative: 0.3 %
Eosinophils Absolute: 73 cells/uL (ref 15–500)
Eosinophils Relative: 0.8 %
HCT: 41 % (ref 35.0–45.0)
Hemoglobin: 12.7 g/dL (ref 11.7–15.5)
Lymphs Abs: 3094 cells/uL (ref 850–3900)
MCH: 22.8 pg — ABNORMAL LOW (ref 27.0–33.0)
MCHC: 31 g/dL — ABNORMAL LOW (ref 32.0–36.0)
MCV: 73.6 fL — ABNORMAL LOW (ref 80.0–100.0)
Monocytes Relative: 6.6 %
Neutro Abs: 5305 cells/uL (ref 1500–7800)
Neutrophils Relative %: 58.3 %
Platelets: 154 10*3/uL (ref 140–400)
RBC: 5.57 10*6/uL — ABNORMAL HIGH (ref 3.80–5.10)
RDW: 16.7 % — ABNORMAL HIGH (ref 11.0–15.0)
Total Lymphocyte: 34 %
WBC: 9.1 10*3/uL (ref 3.8–10.8)

## 2019-05-24 LAB — LIPID PANEL
Cholesterol: 159 mg/dL (ref <200)
HDL: 53 mg/dL (ref >=50)
LDL Cholesterol (Calc): 91 mg/dL (calc)
Non-HDL Cholesterol (Calc): 106 mg/dL (calc) (ref <130)
Total CHOL/HDL Ratio: 3 (calc) (ref <5.0)
Triglycerides: 66 mg/dL (ref <150)

## 2019-05-24 LAB — HEMOGLOBIN A1C
Hgb A1c MFr Bld: 6 % of total Hgb — ABNORMAL HIGH (ref <5.7)
Mean Plasma Glucose: 126 (calc)
eAG (mmol/L): 7 (calc)

## 2019-05-24 LAB — TSH: TSH: 1.69 mIU/L (ref 0.40–4.50)

## 2019-05-24 LAB — VITAMIN D 25 HYDROXY (VIT D DEFICIENCY, FRACTURES): Vit D, 25-Hydroxy: 25 ng/mL — ABNORMAL LOW (ref 30–100)

## 2019-05-24 MED ORDER — VITAMIN D (ERGOCALCIFEROL) 1.25 MG (50000 UNIT) PO CAPS: 50000.0000 [IU] | ORAL_CAPSULE | ORAL | 0 refills | Status: AC

## 2019-05-28 ENCOUNTER — Other Ambulatory Visit: Payer: Self-pay

## 2019-08-17 ENCOUNTER — Other Ambulatory Visit: Payer: Self-pay | Admitting: Family Medicine

## 2019-08-25 ENCOUNTER — Other Ambulatory Visit: Payer: Self-pay

## 2019-08-25 ENCOUNTER — Encounter (HOSPITAL_COMMUNITY): Payer: Self-pay | Admitting: *Deleted

## 2019-08-25 ENCOUNTER — Ambulatory Visit (HOSPITAL_COMMUNITY)
Admission: EM | Admit: 2019-08-25 | Discharge: 2019-08-25 | Disposition: A | Discharge status: Home / Self Care | Attending: Family Medicine | Admitting: Family Medicine

## 2019-08-25 DIAGNOSIS — N390 Urinary tract infection, site not specified: Secondary | ICD-10-CM | POA: Insufficient documentation

## 2019-08-25 LAB — POCT URINALYSIS DIP (DEVICE)
Bilirubin Urine: NEGATIVE
Glucose, UA: NEGATIVE mg/dL
Ketones, ur: NEGATIVE mg/dL
Nitrite: NEGATIVE
Protein, ur: 100 mg/dL — AB
Specific Gravity, Urine: >=1.03 (ref 1.005–1.030)
Urobilinogen, UA: 0.2 mg/dL (ref 0.0–1.0)
pH: 5 (ref 5.0–8.0)

## 2019-08-25 MED ORDER — NITROFURANTOIN MONOHYD MACRO 100 MG PO CAPS: 100.0000 mg | ORAL_CAPSULE | Freq: Two times a day (BID) | ORAL | 0 refills | Status: AC

## 2019-08-25 NOTE — Discharge Instructions (Signed)
Urine showed evidence of infection. We are treating you with macrobid- twice daily for 5 days. Be sure to take full course. Stay hydrated- urine should be pale yellow to clear.  °Please return or follow up with your primary provider if symptoms not improving with treatment. Please return sooner if you have worsening of symptoms or develop fever, nausea, vomiting, abdominal pain, back pain, lightheadedness, dizziness. °

## 2019-08-25 NOTE — ED Provider Notes (Signed)
[facility]    CSN: [id number] Arrival date & time: 08/25/19  1451      History   Chief Complaint Chief Complaint  Patient presents with  . Urinary Frequency  . Dysuria    HPI Angela Bates is a 58 y.o. female history of DM type II, hyperlipidemia, presenting today for evaluation of urinary frequency, dysuria and lower abdominal pain.  Patient states that over the past couple days she has felt an "heaviness" over her bladder.  Today she has developed more dysuria, frequency and urgency.  And recently later in the day has developed hematuria.  She has had UTIs before, but states that her last UTI was many years ago.  Denies vaginal bleeding.  Denies back pain.  Denies fevers nausea or vomiting.  HPI  Past Medical History:  Diagnosis Date  . Diabetes mellitus without complication (HCC)    "borderline"  . Hyperlipidemia     Patient Active Problem List   Diagnosis Date Noted  . Hyperlipidemia 03/10/2015  . Lumbago 10/28/2014  . Stress and adjustment reaction 10/28/2014  . Pain in joint, ankle and foot 02/11/2014  . Bunion of great toe 02/11/2014  . Diabetes mellitus type II, controlled (HCC) 11/26/2013  . Vitamin D deficiency 11/26/2013  . Morbid obesity (HCC) 11/26/2013    Past Surgical History:  Procedure Laterality Date  . ABDOMINAL HYSTERECTOMY  2002    OB History   No obstetric history on file.      Home Medications    Prior to Admission medications   Medication Sig Start Date End Date Taking? Authorizing Provider  Cholecalciferol (VITAMIN D3) 1000 units CAPS Take by mouth.   Yes [provider]  Ferrous Sulfate (IRON PO) Take by mouth.   Yes [provider]  Multiple Vitamin (MULTIVITAMIN) capsule Take 1 capsule by mouth daily.   Yes [provider]  nitrofurantoin, macrocrystal-monohydrate, (MACROBID) 100 MG capsule Take 1 capsule (100 mg total) by mouth 2 (two) times daily for 5 days. 08/25/19 08/30/19  Cody Albus,  Junius Creamer, PA-C    Family History Family History  Problem Relation Age of Onset  . Cancer Father   . Hypertension Mother   . Cancer Brother        Leukemia    Social History Social History   Tobacco Use  . Smoking status: Former Games developer  . Smokeless tobacco: Never Used  . Tobacco comment: quit >10 years prior  Substance Use Topics  . Alcohol use: No  . Drug use: Never     Allergies   Patient has no known allergies.   Review of Systems Review of Systems  Constitutional: Negative for fever.  Respiratory: Negative for shortness of breath.   Cardiovascular: Negative for chest pain.  Gastrointestinal: Positive for abdominal pain. Negative for diarrhea, nausea and vomiting.  Genitourinary: Positive for dysuria and frequency. Negative for flank pain, genital sores, hematuria, menstrual problem, vaginal bleeding, vaginal discharge and vaginal pain.  Musculoskeletal: Negative for back pain.  Skin: Negative for rash.  Neurological: Negative for dizziness, light-headedness and headaches.     Physical Exam Triage Vital Signs ED Triage Vitals  Enc Vitals Group     BP 08/25/19 1525 121/78     Pulse Rate 08/25/19 1525 79     Resp 08/25/19 1525 18     Temp 08/25/19 1525 97.7 F (36.5 C)     Temp Source 08/25/19 1525 Other     SpO2 08/25/19 1525 97 %     Weight --  Height --      Head Circumference --      Peak Flow --      Pain Score 08/25/19 1526 9     Pain Loc --      Pain Edu? --      Excl. in [place]? --    No data found.  Updated Vital Signs BP 121/78   Pulse 79   Temp 97.7 F (36.5 C) (Other (Comment))   Resp 18   SpO2 97%   Visual Acuity Right Eye Distance:   Left Eye Distance:   Bilateral Distance:    Right Eye Near:   Left Eye Near:    Bilateral Near:     Physical Exam Vitals signs and nursing note reviewed.  Constitutional:      Appearance: She is well-developed.     Comments: No acute distress  HENT:     Head: Normocephalic and  atraumatic.     Nose: Nose normal.  Eyes:     Conjunctiva/sclera: Conjunctivae normal.  Neck:     Musculoskeletal: Neck supple.  Cardiovascular:     Rate and Rhythm: Normal rate.  Pulmonary:     Effort: Pulmonary effort is normal. No respiratory distress.  Abdominal:     General: There is no distension.     Comments: Nontender to palpation throughout all 4 quadrants of abdomen, tender to palpation over suprapubic area and lower middle abdomen.  No CVA tenderness  Musculoskeletal: Normal range of motion.  Skin:    General: Skin is warm and dry.  Neurological:     Mental Status: She is alert and oriented to person, place, and time.      UC Treatments / Results  Labs (all labs ordered are listed, but only abnormal results are displayed) Labs Reviewed  POCT URINALYSIS DIP (DEVICE) - Abnormal; Notable for the following components:      Result Value   Hgb urine dipstick LARGE (*)    Protein, ur 100 (*)    Leukocytes,Ua SMALL (*)    All other components within normal limits  URINE CULTURE    EKG   Radiology No results found.  Procedures Procedures (including critical care time)  Medications Ordered in UC Medications - No data to display  Initial Impression / Assessment and Plan / UC Course  I have reviewed the triage vital signs and the nursing notes.  Pertinent labs & imaging results that were available during my care of the patient were reviewed by me and considered in my medical decision making (see chart for details).     UA suggestive of UTI, vital signs stable, do not suspect pyelonephritis.  Placing on Macrobid twice daily x5 days.  Culture sent.  Will call if needing alternative antibiotics.Discussed strict return precautions. Patient verbalized understanding and is agreeable with plan.  Final Clinical Impressions(s) / UC Diagnoses   Final diagnoses:  Lower urinary tract infectious disease     Discharge Instructions     Urine showed evidence of  infection. We are treating you with macrobid-twice daily for 5 days. Be sure to take full course. Stay hydrated- urine should be pale yellow to clear.   Please return or follow up with your primary provider if symptoms not improving with treatment. Please return sooner if you have worsening of symptoms or develop fever, nausea, vomiting, abdominal pain, back pain, lightheadedness, dizziness.   ED Prescriptions    Medication Sig Dispense Auth. Provider   nitrofurantoin, macrocrystal-monohydrate, (MACROBID) 100 MG capsule Take 1 capsule (  100 mg total) by mouth 2 (two) times daily for 5 days. 10 capsule Aubri Gathright, [place] GroveHallie C, PA-C     PDMP not reviewed this encounter.   Lew DawesWieters, Shawntee Mainwaring C, PA-C 08/25/19 1553

## 2019-08-25 NOTE — ED Triage Notes (Signed)
C/O urinary urgency over past couple days; today started with low abd pain and some dysuria.  Denies fevers.

## 2019-08-28 ENCOUNTER — Telehealth (HOSPITAL_COMMUNITY): Payer: Self-pay | Admitting: Emergency Medicine

## 2019-08-28 LAB — URINE CULTURE: Culture: >=100000 — AB

## 2019-08-28 NOTE — Telephone Encounter (Signed)
Urine culture was positive for e coli and was given  macrobid at urgent care visit. Attempted to reach patient. No answer at this time.   

## 2020-05-23 ENCOUNTER — Encounter: Admitting: Family Medicine

## 2020-08-26 ENCOUNTER — Encounter: Admitting: Family Medicine

## 2020-11-17 ENCOUNTER — Encounter: Admitting: Family Medicine

## 2020-11-24 ENCOUNTER — Encounter: Admitting: Family Medicine

## 2020-12-03 ENCOUNTER — Encounter: Payer: Self-pay | Admitting: Family Medicine

## 2020-12-03 ENCOUNTER — Ambulatory Visit (INDEPENDENT_AMBULATORY_CARE_PROVIDER_SITE_OTHER): Admitting: Family Medicine

## 2020-12-03 ENCOUNTER — Other Ambulatory Visit: Payer: Self-pay

## 2020-12-03 VITALS — BP 132/80 | HR 92 | Temp 98.5°F | Resp 14 | Ht 66.0 in | Wt 263.0 lb

## 2020-12-03 DIAGNOSIS — E782 Mixed hyperlipidemia: Secondary | ICD-10-CM

## 2020-12-03 DIAGNOSIS — E559 Vitamin D deficiency, unspecified: Secondary | ICD-10-CM | POA: Diagnosis not present

## 2020-12-03 DIAGNOSIS — Z0001 Encounter for general adult medical examination with abnormal findings: Secondary | ICD-10-CM | POA: Diagnosis not present

## 2020-12-03 DIAGNOSIS — Z0000 Encounter for general adult medical examination without abnormal findings: Secondary | ICD-10-CM

## 2020-12-03 DIAGNOSIS — E119 Type 2 diabetes mellitus without complications: Secondary | ICD-10-CM | POA: Diagnosis not present

## 2020-12-03 DIAGNOSIS — Z1231 Encounter for screening mammogram for malignant neoplasm of breast: Secondary | ICD-10-CM

## 2020-12-03 NOTE — Assessment & Plan Note (Signed)
Diet controlled Discussed reducing carbs/sweets

## 2020-12-03 NOTE — Progress Notes (Signed)
   Subjective:    Patient ID: Angela Bates, female    DOB: December 01, 1960, 60 y.o.   MRN: [id number]  Patient presents for Annual Exam (Is not fasting)   Pt here for CPE   Medications and history reviewed   She had covid back in December symptoms have improved  Dental UTD, getting new partial  Wears glasses- [facility]   Due for mammogram   Ccolonoscopy - Due in Sept 2022  DM diet controlled, last A1C 6% 1 year ago Due for lipids    Review Of Systems:  GEN- denies fatigue, fever, weight loss,weakness, recent illness HEENT- denies eye drainage, change in vision, nasal discharge, CVS- denies chest pain, palpitations RESP- denies SOB, cough, wheeze ABD- denies N/V, change in stools, abd pain GU- denies dysuria, hematuria, dribbling, incontinence MSK- denies joint pain, muscle aches, injury Neuro- denies headache, dizziness, syncope, seizure activity       Objective:    BP 132/80   Pulse 92   Temp 98.5 F (36.9 C) (Temporal)   Resp 14   Ht 5\' 6"  (1.676 m)   Wt 263 lb (119.3 kg)   SpO2 96%   BMI 42.45 kg/m  GEN- NAD, alert and oriented x3 ,obese  HEENT- PERRL, EOMI, non injected sclera, pink conjunctiva, MMM, oropharynx clear Neck- Supple, no thyromegaly CVS- RRR, no murmur RESP-CTAB ABD-NABS,soft,NT,ND Psych normal affect and mood  EXT- No edema Pulses- Radial, DP- 2+   FALL/DEPRESSION/AUDIT C negative      Assessment & Plan:      Problem List Items Addressed This Visit      Unprioritized   Diabetes mellitus type II, controlled (HCC)    Diet controlled Discussed reducing carbs/sweets      Relevant Orders   CBC with Differential/Platelet   Comprehensive metabolic panel   Hemoglobin A1c   Lipid panel   Microalbumin / creatinine urine ratio   HM DIABETES FOOT EXAM (Completed)   Hyperlipidemia   Relevant Orders   Lipid panel   Morbid obesity (HCC)   Vitamin D deficiency   Relevant Orders   Vitamin D, 25-hydroxy    Other Visit Diagnoses     Routine general medical examination at a health care facility    -  Primary   CPE done, pt can get flu/covid booster today, she is scheduled to do so, labs obtained   Encounter for screening mammogram for malignant neoplasm of breast       Relevant Orders   MM 3D SCREEN BREAST BILATERAL      Note: This dictation was prepared with Dragon dictation along with smaller phrase technology. Any transcriptional errors that result from this process are unintentional.

## 2020-12-03 NOTE — Patient Instructions (Addendum)
Vitamin D 1000IU F/U 1 year for Physical - Shanda Bumps Schedule your mammogram

## 2020-12-04 LAB — CBC WITH DIFFERENTIAL/PLATELET
Absolute Monocytes: 717 cells/uL (ref 200–950)
Basophils Absolute: 56 cells/uL (ref 0–200)
Basophils Relative: 0.5 %
Eosinophils Absolute: 146 cells/uL (ref 15–500)
Eosinophils Relative: 1.3 %
HCT: 40.4 % (ref 35.0–45.0)
Hemoglobin: 13 g/dL (ref 11.7–15.5)
Lymphs Abs: 3730 cells/uL (ref 850–3900)
MCH: 23.5 pg — ABNORMAL LOW (ref 27.0–33.0)
MCHC: 32.2 g/dL (ref 32.0–36.0)
MCV: 73.1 fL — ABNORMAL LOW (ref 80.0–100.0)
Monocytes Relative: 6.4 %
Neutro Abs: 6552 cells/uL (ref 1500–7800)
Neutrophils Relative %: 58.5 %
Platelets: 193 10*3/uL (ref 140–400)
RBC: 5.53 10*6/uL — ABNORMAL HIGH (ref 3.80–5.10)
RDW: 17.7 % — ABNORMAL HIGH (ref 11.0–15.0)
Total Lymphocyte: 33.3 %
WBC: 11.2 10*3/uL — ABNORMAL HIGH (ref 3.8–10.8)

## 2020-12-04 LAB — COMPREHENSIVE METABOLIC PANEL
AG Ratio: 1.3 (calc) (ref 1.0–2.5)
ALT: 21 U/L (ref 6–29)
AST: 22 U/L (ref 10–35)
Albumin: 4 g/dL (ref 3.6–5.1)
Alkaline phosphatase (APISO): 97 U/L (ref 37–153)
BUN: 12 mg/dL (ref 7–25)
CO2: 32 mmol/L (ref 20–32)
Calcium: 9.3 mg/dL (ref 8.6–10.4)
Chloride: 106 mmol/L (ref 98–110)
Creat: 0.95 mg/dL (ref 0.50–1.05)
Globulin: 3 g/dL (calc) (ref 1.9–3.7)
Glucose, Bld: 85 mg/dL (ref 65–99)
Potassium: 5 mmol/L (ref 3.5–5.3)
Sodium: 140 mmol/L (ref 135–146)
Total Bilirubin: 0.3 mg/dL (ref 0.2–1.2)
Total Protein: 7 g/dL (ref 6.1–8.1)

## 2020-12-04 LAB — LIPID PANEL
Cholesterol: 186 mg/dL (ref <200)
HDL: 51 mg/dL (ref >=50)
LDL Cholesterol (Calc): 104 mg/dL (calc) — ABNORMAL HIGH
Non-HDL Cholesterol (Calc): 135 mg/dL (calc) — ABNORMAL HIGH (ref <130)
Total CHOL/HDL Ratio: 3.6 (calc) (ref <5.0)
Triglycerides: 191 mg/dL — ABNORMAL HIGH (ref <150)

## 2020-12-04 LAB — VITAMIN D 25 HYDROXY (VIT D DEFICIENCY, FRACTURES): Vit D, 25-Hydroxy: 25 ng/mL — ABNORMAL LOW (ref 30–100)

## 2020-12-04 LAB — MICROALBUMIN / CREATININE URINE RATIO
Creatinine, Urine: 135 mg/dL (ref 20–275)
Microalb Creat Ratio: 4 mcg/mg creat (ref <30)
Microalb, Ur: 0.6 mg/dL

## 2020-12-04 LAB — HEMOGLOBIN A1C
Hgb A1c MFr Bld: 6.3 % of total Hgb — ABNORMAL HIGH (ref <5.7)
Mean Plasma Glucose: 134 mg/dL
eAG (mmol/L): 7.4 mmol/L

## 2021-02-20 ENCOUNTER — Encounter: Payer: Self-pay | Admitting: Family Medicine

## 2021-02-20 LAB — HM DIABETES EYE EXAM

## 2021-04-15 ENCOUNTER — Ambulatory Visit: Admitting: Nurse Practitioner

## 2021-04-15 ENCOUNTER — Other Ambulatory Visit: Payer: Self-pay

## 2021-04-15 ENCOUNTER — Encounter: Payer: Self-pay | Admitting: Nurse Practitioner

## 2021-04-15 VITALS — BP 132/78 | HR 87 | Temp 97.9°F | Ht 66.0 in | Wt 271.6 lb

## 2021-04-15 DIAGNOSIS — R3915 Urgency of urination: Secondary | ICD-10-CM

## 2021-04-15 DIAGNOSIS — E782 Mixed hyperlipidemia: Secondary | ICD-10-CM

## 2021-04-15 DIAGNOSIS — M1990 Unspecified osteoarthritis, unspecified site: Secondary | ICD-10-CM | POA: Insufficient documentation

## 2021-04-15 DIAGNOSIS — E119 Type 2 diabetes mellitus without complications: Secondary | ICD-10-CM | POA: Diagnosis not present

## 2021-04-15 DIAGNOSIS — Z6841 Body Mass Index (BMI) 40.0 and over, adult: Secondary | ICD-10-CM

## 2021-04-15 LAB — URINALYSIS, ROUTINE W REFLEX MICROSCOPIC
Bilirubin Urine: NEGATIVE
Glucose, UA: NEGATIVE
Hgb urine dipstick: NEGATIVE
Ketones, ur: NEGATIVE
Leukocytes,Ua: NEGATIVE
Nitrite: NEGATIVE
Protein, ur: NEGATIVE
Specific Gravity, Urine: 1.023 (ref 1.001–1.035)
pH: 5.5 (ref 5.0–8.0)

## 2021-04-15 MED ORDER — ATORVASTATIN CALCIUM 10 MG PO TABS: 10.0000 mg | ORAL_TABLET | Freq: Every day | ORAL | 1 refills | Status: DC

## 2021-04-15 MED ORDER — DULAGLUTIDE 0.75 MG/0.5ML ~~LOC~~ SOAJ: 0.7500 mg | SUBCUTANEOUS | 1 refills | Status: DC

## 2021-04-15 NOTE — Progress Notes (Signed)
Subjective:    Patient ID: Angela Bates, female    DOB: [birth year], 60 y.o.   MRN: [id number]  HPI: Angela Bates is a 60 y.o. female presenting for urinary urgency and for follow up.  Chief Complaint  Patient presents with   Establish Care   urine problem    Urinary urgency for 3 wks   URINARY SYMPTOMS Duration: weeks Dysuria: no Urinary frequency: yes Urgency: yes Small volume voids: yes Symptom severity: mild Urinary incontinence: yes Foul odor: no Hematuria: no Abdominal pain: no Back pain: no Suprapubic pain/pressure: yes Flank pain: no Fever:  No Nausea: no Vomiting: no Relief with cranberry juice: no Relief with pyridium: no Status: stable Previous urinary tract infection: yes Recurrent urinary tract infection: no Sexual activity: Not sexually active History of sexually transmitted disease: no Vaginal discharge: no Treatments attempted: none    PRE-DIABETES HgbA2c in January 2022 was 6.3%. It was recommended she start on Metformin.  Hypoglycemic episodes:no Polydipsia/polyuria: no Visual disturbance: no Chest pain: no Paresthesias: no Glucose Monitoring: no  Blood Pressure Monitoring: not checking Retinal Examination: Up to Date  Vitamin D deficiency in the past.  Not currently taking Vitamin D replacement.  HYPERLIPIDEMIA Last LDL in January 2022 was 104.  Triglycerides 191. Hyperlipidemia status: uncontrolled Satisfied with current treatment?  Not currently on treatment Side effects:  n/a Medication compliance: n/a Past cholesterol meds: n/a Aspirin:  no The 10-year ASCVD risk score Denman George DC Jr., et al., 2013) is: 11.8%   Values used to calculate the score:     Age: 62 years     Sex: Female     Is Non-Hispanic African American: Yes     Diabetic: Yes     Tobacco smoker: No     Systolic Blood Pressure: 132 mmHg     Is BP treated: No     HDL Cholesterol: 51 mg/dL     Total Cholesterol: 186 mg/dL Chest pain:  no Coronary artery  disease:  no  WEIGHT GAIN Duration: chronic Previous attempts at weight loss: yes Complications of obesity: prediabetes, high cholesterol, fatigue Peak weight: 271 Weight loss goal: TBD Requesting obesity pharmacotherapy: yes Current weight loss supplements/medications: no Previous weight loss supplements/meds: no Physical activity: stays on her feet at lot, 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   Cholecalciferol (VITAMIN D3) 1000 units CAPS Take by mouth.   Dulaglutide 0.75 MG/0.5ML SOPN Inject 0.75 mg into the skin once a week.   Multiple Vitamin (MULTIVITAMIN) capsule Take 1 capsule by mouth daily.   No facility-administered encounter medications on file as of 04/15/2021.    Patient Active Problem List   Diagnosis Date Noted   Osteoarthritis 04/15/2021   Hyperlipidemia 03/10/2015   Lumbago 10/28/2014   Pain in joint, ankle and foot 02/11/2014   Bunion of great toe 02/11/2014   Diabetes mellitus type II, controlled (HCC) 11/26/2013   Vitamin D deficiency 11/26/2013   Morbid obesity with BMI of 40.0-44.9, adult (HCC) 11/26/2013    Past Medical History:  Diagnosis Date   Diabetes mellitus without complication (HCC)    "borderline"   Hyperlipidemia     Relevant past medical, surgical, family and social history reviewed and updated as indicated. Interim medical history since our last visit reviewed.  Review of Systems Per HPI unless  specifically indicated above     Objective:    BP 132/78   Pulse 87   Temp 97.9 F (36.6 C)   Ht 5\' 6"  (1.676 m)   Wt 271 lb 9.6 oz (123.2 kg)   SpO2 97%   BMI 43.84 kg/m   Wt Readings from Last 3 Encounters:  04/15/21 271 lb 9.6 oz (123.2 kg)  12/03/20 263 lb (119.3 kg)  05/22/19 265 lb 12.8 oz (120.6 kg)    Physical Exam Vitals and  nursing note reviewed.  Constitutional:      General: She is not in acute distress.    Appearance: Normal appearance. She is obese. She is not toxic-appearing.  HENT:     Head: Normocephalic and atraumatic.     Right Ear: External ear normal.     Left Ear: External ear normal.  Eyes:     General: No scleral icterus.    Extraocular Movements: Extraocular movements intact.  Neck:     Vascular: No carotid bruit.  Cardiovascular:     Rate and Rhythm: Normal rate and regular rhythm.     Heart sounds: Normal heart sounds. No murmur heard. Pulmonary:     Effort: Pulmonary effort is normal. No respiratory distress.     Breath sounds: Normal breath sounds. No wheezing, rhonchi or rales.  Abdominal:     General: Abdomen is flat. Bowel sounds are normal.     Palpations: Abdomen is soft.     Tenderness: There is no abdominal tenderness.  Musculoskeletal:        General: No swelling or tenderness. Normal range of motion.     Cervical back: Normal range of motion.  Skin:    General: Skin is warm and dry.     Capillary Refill: Capillary refill takes less than 2 seconds.     Coloration: Skin is not jaundiced.     Findings: No bruising.  Neurological:     General: No focal deficit present.     Mental Status: She is alert and oriented to person, place, and time.     Motor: No weakness.     Gait: Gait normal.  Psychiatric:        Mood and Affect: Mood normal.        Behavior: Behavior normal.        Thought Content: Thought content normal.        Judgment: Judgment normal.       Assessment & Plan:   Problem List Items Addressed This Visit       Endocrine   Diabetes mellitus type II, controlled (HCC) - Primary    Chronic.  Last A1c 11/2020 6.3%.  Not currently checking her blood sugars, however does report she has not been eating well and is interested in weight loss.  Discussed options including metformin, GLP-1 therapy.  Will start on dulaglutide 0.75 mg weekly.  Follow-up in 1 month  to recheck labs and see how medication is going.       Relevant Medications   atorvastatin (LIPITOR) 10 MG tablet   Dulaglutide 0.75 MG/0.5ML SOPN     Other   Morbid obesity with BMI of 40.0-44.9, adult (HCC)    Discussed dietary lifestyle changes.  Encouraged small meals with high-protein throughout the day.  Increase physical activity-aerobic exercise goal is 30 minutes 5 times weekly.  Interested in medications-discussed GLP-1 therapy and patient is agreeable.  Showed sample in the office.  We will start dulaglutide 0.75 mg weekly and follow-up in 1  month.  Discussed side effects including nausea/vomiting and to eat smaller meals to prevent the side effects.       Relevant Medications   Dulaglutide 0.75 MG/0.5ML SOPN   Hyperlipidemia    Chronic.  Last lipid panel 11/2020 showed elevated triglycerides 194 and elevated LDL.  Discussed The 10-year ASCVD risk score Denman George DC Montez Hageman., et al., 2013) is: 11.8%   Values used to calculate the score:     Age: 66 years     Sex: Female     Is Non-Hispanic African American: Yes     Diabetic: Yes     Tobacco smoker: No     Systolic Blood Pressure: 132 mmHg     Is BP treated: No     HDL Cholesterol: 51 mg/dL     Total Cholesterol: 186 mg/dL Especially given history of diabetes as well.  Patient agreeable to start low-dose statin-we will start atorvastatin 10 mg daily.  Follow-up in 1 month.       Relevant Medications   atorvastatin (LIPITOR) 10 MG tablet   Other Visit Diagnoses     Urinary urgency       Acute.  Urine dipstick normal.  Question if she may be holding urine for too long.  Discussed toileting schedule and pelvic floor PT.  Follow up prn   Relevant Orders   Urinalysis, Routine w reflex microscopic (Completed)        Follow up plan: Return in about 4 weeks (around 05/13/2021) for weight, predm, lipid f/u.

## 2021-04-15 NOTE — Patient Instructions (Signed)
Semaglutide injection solution What is this medicine? SEMAGLUTIDE (Sem a GLOO tide) is used to improve blood sugar control in adults with type 2 diabetes. This medicine may be used with other diabetes medicines. This drug may also reduce the risk of heart attack or stroke if you have type 2 diabetes and risk factors for heart disease. This medicine may be used for other purposes; ask your health care provider or pharmacist if you have questions. COMMON BRAND NAME(S): OZEMPIC What should I tell my health care provider before I take this medicine? They need to know if you have any of these conditions: endocrine tumors (MEN 2) or if someone in your family had these tumors eye disease, vision problems history of pancreatitis kidney disease stomach problems thyroid cancer or if someone in your family had thyroid cancer an unusual or allergic reaction to semaglutide, other medicines, foods, dyes, or preservatives pregnant or trying to get pregnant breast-feeding How should I use this medicine? This medicine is for injection under the skin of your upper leg (thigh), stomach area, or upper arm. It is given once every week (every 7 days). You will be taught how to prepare and give this medicine. Use exactly as directed. Take your medicine at regular intervals. Do not take it more often than directed. If you use this medicine with insulin, you should inject this medicine and the insulin separately. Do not mix them together. Do not give the injections right next to each other. Change (rotate) injection sites with each injection. It is important that you put your used needles and syringes in a special sharps container. Do not put them in a trash can. If you do not have a sharps container, call your pharmacist or healthcare provider to get one. A special MedGuide will be given to you by the pharmacist with each prescription and refill. Be sure to read this information carefully each time. This drug comes  with INSTRUCTIONS FOR USE. Ask your pharmacist for directions on how to use this drug. Read the information carefully. Talk to your pharmacist or health care provider if you have questions. Talk to your pediatrician regarding the use of this medicine in children. Special care may be needed. Overdosage: If you think you have taken too much of this medicine contact a poison control center or emergency room at once. NOTE: This medicine is only for you. Do not share this medicine with others. What if I miss a dose? If you miss a dose, take it as soon as you can within 5 days after the missed dose. Then take your next dose at your regular weekly time. If it has been longer than 5 days after the missed dose, do not take the missed dose. Take the next dose at your regular time. Do not take double or extra doses. If you have questions about a missed dose, contact your health care provider for advice. What may interact with this medicine? other medicines for diabetes Many medications may cause changes in blood sugar, these include: alcohol containing beverages antiviral medicines for HIV or AIDS aspirin and aspirin-like drugs certain medicines for blood pressure, heart disease, irregular heart beat chromium diuretics female hormones, such as estrogens or progestins, birth control pills fenofibrate gemfibrozil isoniazid lanreotide female hormones or anabolic steroids MAOIs like Carbex, Eldepryl, Marplan, Nardil, and Parnate medicines for weight loss medicines for allergies, asthma, cold, or cough medicines for depression, anxiety, or psychotic disturbances niacin nicotine NSAIDs, medicines for pain and inflammation, like ibuprofen or naproxen octreotide   pasireotide pentamidine phenytoin probenecid quinolone antibiotics such as ciprofloxacin, levofloxacin, ofloxacin some herbal dietary supplements steroid medicines such as prednisone or cortisone sulfamethoxazole; trimethoprim thyroid  hormones Some medications can hide the warning symptoms of low blood sugar (hypoglycemia). You may need to monitor your blood sugar more closely if you are taking one of these medications. These include: beta-blockers, often used for high blood pressure or heart problems (examples include atenolol, metoprolol, propranolol) clonidine guanethidine reserpine This list may not describe all possible interactions. Give your health care provider a list of all the medicines, herbs, non-prescription drugs, or dietary supplements you use. Also tell them if you smoke, drink alcohol, or use illegal drugs. Some items may interact with your medicine. What should I watch for while using this medicine? Visit your doctor or health care professional for regular checks on your progress. Drink plenty of fluids while taking this medicine. Check with your doctor or health care professional if you get an attack of severe diarrhea, nausea, and vomiting. The loss of too much body fluid can make it dangerous for you to take this medicine. A test called the HbA1C (A1C) will be monitored. This is a simple blood test. It measures your blood sugar control over the last 2 to 3 months. You will receive this test every 3 to 6 months. Learn how to check your blood sugar. Learn the symptoms of low and high blood sugar and how to manage them. Always carry a quick-source of sugar with you in case you have symptoms of low blood sugar. Examples include hard sugar candy or glucose tablets. Make sure others know that you can choke if you eat or drink when you develop serious symptoms of low blood sugar, such as seizures or unconsciousness. They must get medical help at once. Tell your doctor or health care professional if you have high blood sugar. You might need to change the dose of your medicine. If you are sick or exercising more than usual, you might need to change the dose of your medicine. Do not skip meals. Ask your doctor or health  care professional if you should avoid alcohol. Many nonprescription cough and cold products contain sugar or alcohol. These can affect blood sugar. Pens should never be shared. Even if the needle is changed, sharing may result in passing of viruses like hepatitis or HIV. Wear a medical ID bracelet or chain, and carry a card that describes your disease and details of your medicine and dosage times. Do not become pregnant while taking this medicine. Women should inform their doctor if they wish to become pregnant or think they might be pregnant. There is a potential for serious side effects to an unborn child. Talk to your health care professional or pharmacist for more information. What side effects may I notice from receiving this medicine? Side effects that you should report to your doctor or health care professional as soon as possible: allergic reactions like skin rash, itching or hives, swelling of the face, lips, or tongue breathing problems changes in vision diarrhea that continues or is severe lump or swelling on the neck severe nausea signs and symptoms of infection like fever or chills; cough; sore throat; pain or trouble passing urine signs and symptoms of low blood sugar such as feeling anxious, confusion, dizziness, increased hunger, unusually weak or tired, sweating, shakiness, cold, irritable, headache, blurred vision, fast heartbeat, loss of consciousness signs and symptoms of kidney injury like trouble passing urine or change in the amount of urine   trouble swallowing unusual stomach upset or pain vomiting Side effects that usually do not require medical attention (report to your doctor or health care professional if they continue or are bothersome): constipation diarrhea nausea pain, redness, or irritation at site where injected stomach upset This list may not describe all possible side effects. Call your doctor for medical advice about side effects. You may report side  effects to FDA at 1-800-FDA-1088. Where should I keep my medicine? Keep out of the reach of children. Store unopened pens in a refrigerator between 2 and 8 degrees C (36 and 46 degrees F). Do not freeze. Protect from light and heat. After you first use the pen, it can be stored for 56 days at room temperature between 15 and 30 degrees C (59 and 86 degrees F) or in a refrigerator. Throw away your used pen after 56 days or after the expiration date, whichever comes first. Do not store your pen with the needle attached. If the needle is left on, medicine may leak from the pen. NOTE: This sheet is a summary. It may not cover all possible information. If you have questions about this medicine, talk to your doctor, pharmacist, or health care provider.  2021 Elsevier/Gold Standard (2019-07-10 09:41:51)  

## 2021-04-16 ENCOUNTER — Telehealth: Payer: Self-pay | Admitting: Nurse Practitioner

## 2021-04-16 NOTE — Telephone Encounter (Signed)
Pt called in stating that a med for her weight loss (an injection med)needs a second authorization for it to be released from the pharmacy. Please call  Cb#: [phone]

## 2021-04-16 NOTE — Assessment & Plan Note (Signed)
Discussed dietary lifestyle changes.  Encouraged small meals with high-protein throughout the day.  Increase physical activity-aerobic exercise goal is 30 minutes 5 times weekly.  Interested in medications-discussed GLP-1 therapy and patient is agreeable.  Showed sample in the office.  We will start dulaglutide 0.75 mg weekly and follow-up in 1 month.  Discussed side effects including nausea/vomiting and to eat smaller meals to prevent the side effects.

## 2021-04-16 NOTE — Assessment & Plan Note (Signed)
Chronic.  Last A1c 11/2020 6.3%.  Not currently checking her blood sugars, however does report she has not been eating well and is interested in weight loss.  Discussed options including metformin, GLP-1 therapy.  Will start on dulaglutide 0.75 mg weekly.  Follow-up in 1 month to recheck labs and see how medication is going.

## 2021-04-16 NOTE — Assessment & Plan Note (Signed)
Chronic.  Last lipid panel 11/2020 showed elevated triglycerides 194 and elevated LDL.  Discussed The 10-year ASCVD risk score Denman George DC Montez Hageman., et al., 2013) is: 11.8%   Values used to calculate the score:     Age: 60 years     Sex: Female     Is Non-Hispanic African American: Yes     Diabetic: Yes     Tobacco smoker: No     Systolic Blood Pressure: 132 mmHg     Is BP treated: No     HDL Cholesterol: 51 mg/dL     Total Cholesterol: 186 mg/dL Especially given history of diabetes as well.  Patient agreeable to start low-dose statin-we will start atorvastatin 10 mg daily.  Follow-up in 1 month.

## 2021-04-20 ENCOUNTER — Telehealth: Payer: Self-pay

## 2021-04-20 NOTE — Telephone Encounter (Signed)
Approvedtoday [id number];Review Type:Prior Auth;Coverage Start Date:03/21/2021;Coverage End Date:11/07/2098;

## 2021-04-20 NOTE — Telephone Encounter (Signed)
Spoke with pt, PA has been approved

## 2021-05-13 ENCOUNTER — Encounter: Payer: Self-pay | Admitting: Nurse Practitioner

## 2021-05-13 ENCOUNTER — Ambulatory Visit (INDEPENDENT_AMBULATORY_CARE_PROVIDER_SITE_OTHER): Admitting: Nurse Practitioner

## 2021-05-13 ENCOUNTER — Other Ambulatory Visit: Payer: Self-pay

## 2021-05-13 VITALS — BP 122/84 | HR 82 | Temp 98.1°F | Ht 66.0 in | Wt 267.6 lb

## 2021-05-13 DIAGNOSIS — E119 Type 2 diabetes mellitus without complications: Secondary | ICD-10-CM | POA: Diagnosis not present

## 2021-05-13 DIAGNOSIS — E559 Vitamin D deficiency, unspecified: Secondary | ICD-10-CM | POA: Diagnosis not present

## 2021-05-13 DIAGNOSIS — Z6841 Body Mass Index (BMI) 40.0 and over, adult: Secondary | ICD-10-CM

## 2021-05-13 DIAGNOSIS — E782 Mixed hyperlipidemia: Secondary | ICD-10-CM

## 2021-05-13 MED ORDER — DULAGLUTIDE 1.5 MG/0.5ML ~~LOC~~ SOAJ: 1.5000 mg | SUBCUTANEOUS | 1 refills | Status: DC

## 2021-05-13 NOTE — Progress Notes (Signed)
Subjective:    Patient ID: Angela Bates, female    DOB: 31-Jan-1961, 60 y.o.   MRN: [id number]  HPI: Angela Bates is a 60 y.o. female presenting for weight loss and diabetes follow up.  Chief Complaint  Patient presents with   Follow-up    Weight loss,pre dm and lipid follow up   WEIGHT GAIN Duration: chronic Previous attempts at weight loss: yes Complications of obesity: diabetes, high cholesterol, fatigue Peak weight: 271 Weight loss goal: TBC  Weight loss to date: 5 lbs Current weight loss supplements/medications:  Trulicity Previous weight loss supplements/meds: no Water intake: 5 bottles - 16 oz - daily.   Physical activity: She reports she stays busy but is not walk enough.  Is planning on increasing physical activity including walking.  DIABETES Started Trulicity and is tolerating well.  No side effects.  Highest A1c 6.5%.  Hypoglycemic episodes:no Polydipsia/polyuria: no Visual disturbance: no Chest pain: no Paresthesias: no Glucose Monitoring: no   HYPERLIPIDEMIA Started atorvastatin 10 mg daily at last visit.  Reports she is tolerating it well. Hyperlipidemia status: Uncontrolled Satisfied with current treatment?  yes Side effects:  no Medication compliance: excellent compliance Aspirin:  no The 10-year ASCVD risk score Denman George DC Jr., et al., 2013) is: 7.6%   Values used to calculate the score:     Age: 60 years     Sex: Female     Is Non-Hispanic African American: Yes     Diabetic: Yes     Tobacco smoker: No     Systolic Blood Pressure: 122 mmHg     Is BP treated: No     HDL Cholesterol: 50 mg/dL     Total Cholesterol: 131 mg/dL Chest pain:  no Coronary artery disease:  no Family history CAD:  no Family history early CAD:  no   No Known Allergies  Outpatient Encounter Medications as of 05/13/2021  Medication Sig   atorvastatin (LIPITOR) 10 MG tablet Take 1 tablet (10 mg total) by mouth daily.   Cholecalciferol (VITAMIN D3) 1000 units CAPS  Take by mouth.   Dulaglutide 1.5 MG/0.5ML SOPN Inject 1.5 mg into the skin once a week.   Multiple Vitamin (MULTIVITAMIN) capsule Take 1 capsule by mouth daily.   [DISCONTINUED] Dulaglutide 0.75 MG/0.5ML SOPN Inject 0.75 mg into the skin once a week.   No facility-administered encounter medications on file as of 05/13/2021.    Patient Active Problem List   Diagnosis Date Noted   Osteoarthritis 04/15/2021   Hyperlipidemia 03/10/2015   Lumbago 10/28/2014   Pain in joint, ankle and foot 02/11/2014   Bunion of great toe 02/11/2014   Diabetes mellitus type II, controlled (HCC) 11/26/2013   Vitamin D deficiency 11/26/2013   Morbid obesity with BMI of 40.0-44.9, adult (HCC) 11/26/2013    Past Medical History:  Diagnosis Date   Diabetes mellitus without complication (HCC)    "borderline"   Hyperlipidemia     Relevant past medical, surgical, family and social history reviewed and updated as indicated. Interim medical history since our last visit reviewed.  Review of Systems Per HPI unless specifically indicated above     Objective:    BP 122/84   Pulse 82   Temp 98.1 F (36.7 C)   Ht 5\' 6"  (1.676 m)   Wt 267 lb 9.6 oz (121.4 kg)   SpO2 93%   BMI 43.19 kg/m   Wt Readings from Last 3 Encounters:  05/13/21 267 lb 9.6 oz (121.4 kg)  04/15/21 271  lb 9.6 oz (123.2 kg)  12/03/20 263 lb (119.3 kg)    Physical Exam Vitals and nursing note reviewed.  Constitutional:      General: She is not in acute distress.    Appearance: Normal appearance. She is obese. She is not toxic-appearing.  Eyes:     General: No scleral icterus.    Extraocular Movements: Extraocular movements intact.  Cardiovascular:     Rate and Rhythm: Normal rate and regular rhythm.     Heart sounds: Normal heart sounds. No murmur heard. Pulmonary:     Effort: Pulmonary effort is normal. No respiratory distress.     Breath sounds: Normal breath sounds. No wheezing, rhonchi or rales.  Abdominal:     General:  Abdomen is flat. Bowel sounds are normal.     Palpations: Abdomen is soft.     Tenderness: There is no abdominal tenderness.  Musculoskeletal:        General: No swelling or tenderness. Normal range of motion.     Right lower leg: No edema.     Left lower leg: No edema.  Skin:    General: Skin is warm and dry.     Capillary Refill: Capillary refill takes less than 2 seconds.     Coloration: Skin is not jaundiced.     Findings: No bruising.  Neurological:     Mental Status: She is alert and oriented to person, place, and time.     Motor: No weakness.     Gait: Gait normal.  Psychiatric:        Mood and Affect: Mood normal.        Behavior: Behavior normal.        Thought Content: Thought content normal.        Judgment: Judgment normal.      Assessment & Plan:   Problem List Items Addressed This Visit       Endocrine   Diabetes mellitus type II, controlled (HCC)    Chronic.  Recheck hemoglobin A1c today.  Suspect it has improved greatly given weight loss and GLP-1 therapy initiation.  Follow-up in 3 months.       Relevant Medications   Dulaglutide 1.5 MG/0.5ML SOPN   Other Relevant Orders   Hemoglobin A1c (Completed)   COMPLETE METABOLIC PANEL WITH GFR (Completed)     Other   Vitamin D deficiency    Chronic.  Currently taking over-the-counter vitamin D.  We will recheck today and follow-up in 6 months.       Relevant Orders   VITAMIN D 25 Hydroxy (Vit-D Deficiency, Fractures) (Completed)   Morbid obesity with BMI of 40.0-44.9, adult (HCC)    Congratulated on great weight loss of 5 pounds.  Encourage physical activity-goal is 30 minutes 5 times weekly.  Tolerating GLP-1 therapy well.       Relevant Medications   Dulaglutide 1.5 MG/0.5ML SOPN   Other Relevant Orders   COMPLETE METABOLIC PANEL WITH GFR (Completed)   Hyperlipidemia - Primary    Chronic.  Reports tolerating atorvastatin 10 mg daily well.  We will recheck lipids today-patient is fasting.  We will  also check liver enzymes.  Follow-up pending blood work.       Relevant Orders   Lipid panel (Completed)   COMPLETE METABOLIC PANEL WITH GFR (Completed)     Follow up plan: Return in about 3 months (around 08/13/2021) for weight loss, predm.

## 2021-05-14 ENCOUNTER — Encounter: Payer: Self-pay | Admitting: Nurse Practitioner

## 2021-05-14 DIAGNOSIS — N182 Chronic kidney disease, stage 2 (mild): Secondary | ICD-10-CM | POA: Insufficient documentation

## 2021-05-14 LAB — COMPLETE METABOLIC PANEL WITH GFR
AG Ratio: 1.3 (calc) (ref 1.0–2.5)
ALT: 12 U/L (ref 6–29)
AST: 16 U/L (ref 10–35)
Albumin: 3.9 g/dL (ref 3.6–5.1)
Alkaline phosphatase (APISO): 91 U/L (ref 37–153)
BUN/Creatinine Ratio: 10 (calc) (ref 6–22)
BUN: 11 mg/dL (ref 7–25)
CO2: 27 mmol/L (ref 20–32)
Calcium: 9.4 mg/dL (ref 8.6–10.4)
Chloride: 107 mmol/L (ref 98–110)
Creat: 1.14 mg/dL — ABNORMAL HIGH (ref 0.50–0.99)
GFR, Est African American: 61 mL/min/{1.73_m2} (ref >=60)
GFR, Est Non African American: 52 mL/min/{1.73_m2} — ABNORMAL LOW (ref >=60)
Globulin: 3.1 g/dL (calc) (ref 1.9–3.7)
Glucose, Bld: 92 mg/dL (ref 65–99)
Potassium: 5.1 mmol/L (ref 3.5–5.3)
Sodium: 142 mmol/L (ref 135–146)
Total Bilirubin: 0.5 mg/dL (ref 0.2–1.2)
Total Protein: 7 g/dL (ref 6.1–8.1)

## 2021-05-14 LAB — HEMOGLOBIN A1C
Hgb A1c MFr Bld: 6 % of total Hgb — ABNORMAL HIGH (ref <5.7)
Mean Plasma Glucose: 126 mg/dL
eAG (mmol/L): 7 mmol/L

## 2021-05-14 LAB — LIPID PANEL
Cholesterol: 131 mg/dL (ref <200)
HDL: 50 mg/dL (ref >=50)
LDL Cholesterol (Calc): 66 mg/dL (calc)
Non-HDL Cholesterol (Calc): 81 mg/dL (calc) (ref <130)
Total CHOL/HDL Ratio: 2.6 (calc) (ref <5.0)
Triglycerides: 66 mg/dL (ref <150)

## 2021-05-14 LAB — VITAMIN D 25 HYDROXY (VIT D DEFICIENCY, FRACTURES): Vit D, 25-Hydroxy: 38 ng/mL (ref 30–100)

## 2021-05-14 NOTE — Progress Notes (Signed)
Spoke with pt regarding lab results. 

## 2021-05-14 NOTE — Assessment & Plan Note (Signed)
Chronic.  Recheck hemoglobin A1c today.  Suspect it has improved greatly given weight loss and GLP-1 therapy initiation.  Follow-up in 3 months.

## 2021-05-14 NOTE — Assessment & Plan Note (Signed)
Chronic.  Reports tolerating atorvastatin 10 mg daily well.  We will recheck lipids today-patient is fasting.  We will also check liver enzymes.  Follow-up pending blood work.

## 2021-05-14 NOTE — Assessment & Plan Note (Signed)
Congratulated on great weight loss of 5 pounds.  Encourage physical activity-goal is 30 minutes 5 times weekly.  Tolerating GLP-1 therapy well.

## 2021-05-14 NOTE — Assessment & Plan Note (Signed)
Chronic.  Currently taking over-the-counter vitamin D.  We will recheck today and follow-up in 6 months.

## 2021-07-10 ENCOUNTER — Other Ambulatory Visit: Payer: Self-pay | Admitting: Nurse Practitioner

## 2021-07-10 DIAGNOSIS — E119 Type 2 diabetes mellitus without complications: Secondary | ICD-10-CM

## 2021-07-16 ENCOUNTER — Telehealth: Payer: Self-pay | Admitting: Nurse Practitioner

## 2021-07-16 DIAGNOSIS — E119 Type 2 diabetes mellitus without complications: Secondary | ICD-10-CM

## 2021-07-16 MED ORDER — TRULICITY 1.5 MG/0.5ML ~~LOC~~ SOAJ: SUBCUTANEOUS | 1 refills | Status: DC

## 2021-07-16 NOTE — Telephone Encounter (Signed)
Patient called to follow up on refill needed for TRULICITY 1.5 MG/0.5ML SOPN [[id number]]   Took last pill Sat, 07/11/21.  Pharmacy confirmed as [id number] [place], [place] - [address] AT [facility]  [address] [place] [address]  Phone:  [phone]  Fax:  [phone]  DEA #:  [id number]   Please advise at [phone].

## 2021-07-16 NOTE — Telephone Encounter (Signed)
Injection sent to pharmacy.

## 2021-08-14 ENCOUNTER — Other Ambulatory Visit: Payer: Self-pay

## 2021-08-14 ENCOUNTER — Encounter: Payer: Self-pay | Admitting: Nurse Practitioner

## 2021-08-14 ENCOUNTER — Ambulatory Visit (INDEPENDENT_AMBULATORY_CARE_PROVIDER_SITE_OTHER): Admitting: Nurse Practitioner

## 2021-08-14 VITALS — BP 132/88 | HR 85 | Temp 98.2°F | Ht 66.0 in | Wt 258.4 lb

## 2021-08-14 DIAGNOSIS — E1122 Type 2 diabetes mellitus with diabetic chronic kidney disease: Secondary | ICD-10-CM | POA: Diagnosis not present

## 2021-08-14 DIAGNOSIS — E119 Type 2 diabetes mellitus without complications: Secondary | ICD-10-CM

## 2021-08-14 DIAGNOSIS — Z6841 Body Mass Index (BMI) 40.0 and over, adult: Secondary | ICD-10-CM

## 2021-08-14 DIAGNOSIS — Z1231 Encounter for screening mammogram for malignant neoplasm of breast: Secondary | ICD-10-CM

## 2021-08-14 DIAGNOSIS — N182 Chronic kidney disease, stage 2 (mild): Secondary | ICD-10-CM

## 2021-08-14 MED ORDER — TRULICITY 1.5 MG/0.5ML ~~LOC~~ SOAJ: SUBCUTANEOUS | 0 refills | Status: DC

## 2021-08-14 NOTE — Assessment & Plan Note (Signed)
Congratulated patient on her great success with 10 pounds of weight loss in the past 3 months.  Continue to encourage dietary and lifestyle changes.  She is tolerating GLP-1 therapy well and we will plan to increase when she resumes this medication.  Follow-up in 3 months.

## 2021-08-14 NOTE — Progress Notes (Signed)
Subjective:    Patient ID: Angela Bates, female    DOB: [birth year], 60 y.o.   MRN: [id number]  HPI: Angela Bates is a 60 y.o. female presenting for weight loss follow up.  No chief complaint on file.  DIABETES Last A1c was 6.0% in July 2022.  She recently ran out of this mediation.  She was tolerating dulaglutide 1.5 mg and tolerating it well.  She did stop taking atorvastatin because it gave her an itchy rash. Hypoglycemic episodes:no Polydipsia/polyuria: no Visual disturbance: no Chest pain: no Paresthesias: no Glucose Monitoring: no Taking Insulin?: no Blood Pressure Monitoring: not checking Retinal Examination: Up to Date Foot Exam: Up to Date Diabetic Education: Completed Pneumovax: Up to Date Influenza: Not up to Date Aspirin: no  ELEVATED BMI: 41 Duration: chronic Previous attempts at weight loss: yes Complications of obesity: diabetes, high cholesterol, low vitamin D Peak weight: 271 Weight loss goal: wants to be size 12-14  Weight loss to date: ~10 lbs Requesting obesity pharmacotherapy: yes Current weight loss supplements/medications: yes Previous weight loss supplements/meds: yes Water intake: 64 oz  Dietary changes: she is eating more baked foods, likes salad and veggies.  She is avoiding sweets and carbohydrates. 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   Dulaglutide (TRULICITY) 1.5 MG/0.5ML SOPN Inject 0.75 mg into the skin once a week for 7 days, THEN 1.5 mg once a week for 7 days, THEN 3 mg once a week. ADMINISTER 1.5 MG UNDER THE SKIN 1 TIME A WEEK.   Multiple Vitamin (MULTIVITAMIN) capsule Take 1 capsule by mouth daily.   [DISCONTINUED] atorvastatin (LIPITOR) 10 MG tablet Take 1 tablet (10 mg total) by mouth daily.    [DISCONTINUED] Dulaglutide (TRULICITY) 1.5 MG/0.5ML SOPN ADMINISTER 1.5 MG UNDER THE SKIN 1 TIME A WEEK   No facility-administered encounter medications on file as of 08/14/2021.    Patient Active Problem List   Diagnosis Date Noted   CKD stage 2 due to type 2 diabetes mellitus (HCC) 05/14/2021   Osteoarthritis 04/15/2021   Hyperlipidemia 03/10/2015   Lumbago 10/28/2014   Pain in joint, ankle and foot 02/11/2014   Bunion of great toe 02/11/2014   Diabetes mellitus type II, controlled (HCC) 11/26/2013   Vitamin D deficiency 11/26/2013   Morbid obesity with BMI of 40.0-44.9, adult (HCC) 11/26/2013    Past Medical History:  Diagnosis Date   Diabetes mellitus without complication (HCC)    "borderline"   Hyperlipidemia     Relevant past medical, surgical, family and social history reviewed and updated as indicated. Interim medical history since our last visit reviewed.  Review of Systems Per HPI unless specifically indicated above     Objective:    BP 132/88   Pulse 85   Temp 98.2 F (36.8 C) (Oral)   Ht 5\' 6"  (1.676 m)   Wt 258 lb 6.4 oz (117.2 kg)   SpO2 97%   BMI 41.71 kg/m   Wt Readings from Last 3 Encounters:  08/14/21 258 lb 6.4 oz (117.2 kg)  05/13/21 267 lb 9.6 oz (121.4 kg)  04/15/21 271 lb 9.6 oz (123.2 kg)    Physical Exam Vitals and nursing note reviewed.  Constitutional:      General: She is not in acute distress.  Appearance: Normal appearance. She is obese. She is not toxic-appearing.  Neck:     Vascular: No carotid bruit.  Cardiovascular:     Rate and Rhythm: Normal rate and regular rhythm.     Heart sounds: Normal heart sounds. No murmur heard. Pulmonary:     Effort: Pulmonary effort is normal. No respiratory distress.     Breath sounds: Normal breath sounds. No wheezing, rhonchi or rales.  Musculoskeletal:     Cervical back: Normal range of motion.  Skin:    General: Skin is warm and dry.     Coloration: Skin is not jaundiced or pale.      Findings: No erythema.  Neurological:     Mental Status: She is alert and oriented to person, place, and time.     Motor: No weakness.     Gait: Gait normal.  Psychiatric:        Mood and Affect: Mood normal.        Behavior: Behavior normal.        Thought Content: Thought content normal.        Judgment: Judgment normal.      Assessment & Plan:   Problem List Items Addressed This Visit       Endocrine   Diabetes mellitus type II, controlled (HCC) - Primary    Chronic.  Last A1c was 6.0%.  Goal is less than 7%.  She was previously tolerating dulaglutide 1.5 mg weekly well, however has not.  We will resume dulaglutide at 0.75 mg weekly, increase to 1.5 mg weekly after 1 dose, then increase to dulaglutide 3 mg weekly thereafter.  She did take atorvastatin, however this made her itch, so she stopped it.  I think she has an allergic reaction and I have marked this as an allergy.  Her foot exam and eye exam are up-to-date.  Her blood pressure is excellent today in clinic.  I congratulated her on the great success with weight loss and encouraged continued dietary and lifestyle changes including increasing physical activity-goal is 30 minutes 5 times weekly.  Follow-up in 3 months.      Relevant Medications   Dulaglutide (TRULICITY) 1.5 MG/0.5ML SOPN   Other Relevant Orders   BASIC METABOLIC PANEL WITH GFR     Other   Morbid obesity with BMI of 40.0-44.9, adult (HCC)    Congratulated patient on her great success with 10 pounds of weight loss in the past 3 months.  Continue to encourage dietary and lifestyle changes.  She is tolerating GLP-1 therapy well and we will plan to increase when she resumes this medication.  Follow-up in 3 months.      Relevant Medications   Dulaglutide (TRULICITY) 1.5 MG/0.5ML SOPN   Other Visit Diagnoses     Morbid obesity (HCC)       Relevant Medications   Dulaglutide (TRULICITY) 1.5 MG/0.5ML SOPN   Encounter for screening mammogram for malignant neoplasm  of breast       Relevant Orders   MM 3D SCREEN BREAST BILATERAL        Follow up plan: Return in about 3 months (around 11/14/2021) for Diabetes, weight loss follow-up.

## 2021-08-14 NOTE — Assessment & Plan Note (Signed)
Chronic.  Last A1c was 6.0%.  Goal is less than 7%.  She was previously tolerating dulaglutide 1.5 mg weekly well, however has not.  We will resume dulaglutide at 0.75 mg weekly, increase to 1.5 mg weekly after 1 dose, then increase to dulaglutide 3 mg weekly thereafter.  She did take atorvastatin, however this made her itch, so she stopped it.  I think she has an allergic reaction and I have marked this as an allergy.  Her foot exam and eye exam are up-to-date.  Her blood pressure is excellent today in clinic.  I congratulated her on the great success with weight loss and encouraged continued dietary and lifestyle changes including increasing physical activity-goal is 30 minutes 5 times weekly.  Follow-up in 3 months.

## 2021-08-15 LAB — BASIC METABOLIC PANEL WITH GFR
BUN/Creatinine Ratio: 16 (calc) (ref 6–22)
BUN: 17 mg/dL (ref 7–25)
CO2: 27 mmol/L (ref 20–32)
Calcium: 9.3 mg/dL (ref 8.6–10.4)
Chloride: 105 mmol/L (ref 98–110)
Creat: 1.06 mg/dL — ABNORMAL HIGH (ref 0.50–1.05)
Glucose, Bld: 98 mg/dL (ref 65–99)
Potassium: 4.4 mmol/L (ref 3.5–5.3)
Sodium: 140 mmol/L (ref 135–146)
eGFR: 60 mL/min/{1.73_m2} (ref >=60)

## 2021-08-19 ENCOUNTER — Other Ambulatory Visit: Payer: Self-pay | Admitting: *Deleted

## 2021-08-19 MED ORDER — TRULICITY 1.5 MG/0.5ML ~~LOC~~ SOAJ: 1.5000 mg | SUBCUTANEOUS | 3 refills | Status: DC

## 2021-08-19 MED ORDER — TRULICITY 0.75 MG/0.5ML ~~LOC~~ SOAJ: 0.7500 mg | SUBCUTANEOUS | 0 refills | Status: DC

## 2021-11-19 ENCOUNTER — Other Ambulatory Visit: Payer: Self-pay

## 2021-11-19 ENCOUNTER — Ambulatory Visit: Admitting: Nurse Practitioner

## 2021-11-19 ENCOUNTER — Encounter: Payer: Self-pay | Admitting: Nurse Practitioner

## 2021-11-19 VITALS — BP 118/70 | HR 79 | Ht 66.0 in | Wt 259.0 lb

## 2021-11-19 DIAGNOSIS — Z23 Encounter for immunization: Secondary | ICD-10-CM

## 2021-11-19 DIAGNOSIS — E1122 Type 2 diabetes mellitus with diabetic chronic kidney disease: Secondary | ICD-10-CM

## 2021-11-19 DIAGNOSIS — E559 Vitamin D deficiency, unspecified: Secondary | ICD-10-CM | POA: Diagnosis not present

## 2021-11-19 DIAGNOSIS — M545 Low back pain, unspecified: Secondary | ICD-10-CM | POA: Diagnosis not present

## 2021-11-19 DIAGNOSIS — N182 Chronic kidney disease, stage 2 (mild): Secondary | ICD-10-CM

## 2021-11-19 DIAGNOSIS — Z1211 Encounter for screening for malignant neoplasm of colon: Secondary | ICD-10-CM

## 2021-11-19 DIAGNOSIS — R3915 Urgency of urination: Secondary | ICD-10-CM

## 2021-11-19 DIAGNOSIS — Z6841 Body Mass Index (BMI) 40.0 and over, adult: Secondary | ICD-10-CM

## 2021-11-19 NOTE — Progress Notes (Signed)
Subjective:    Patient ID: Angela Bates, female    DOB: [birth year], 61 y.o.   MRN: [id number]  HPI: Angela Bates is a 61 y.o. female presenting for follow up.  Chief Complaint  Patient presents with   Diabetes   Hyperlipidemia   Over Active Bladder   Having some back pain when she wakes up, gets better throughout the day.  She has a bed that is adjustable and sleeps with her head slightly elevated.    DIABETES Trulicity is going "good".  Gives herself injection every Friday and switches sides in her stomach.  Not sure which dose she is taking, she thinks 0.75 mg weekly.   Hypoglycemic episodes:no Polydipsia/polyuria: no Visual disturbance: no Chest pain: no Paresthesias: no Glucose Monitoring: no Taking Insulin?: no Blood Pressure Monitoring: not checking Home BP: Retinal Examination:  up to date, goes in April Foot Exam: Up to Date Diabetic Education: Completed Pneumovax:  Going to get Prevnar 20 today Influenza: going to get today Aspirin: no Statin: no; had reaction to statin  Physical activity: walks laps around the gym 5 days per week Dietary habits: prepares food at home, currently on meat fast  A1c goal:  less than 7%  CHRONIC KIDNEY DISEASE CKD status: stable Medications renally dose: yes Previous renal evaluation: no Pneumovax:  Up to Date Influenza Vaccine:  Up to Date  VITAMIN D DEFICIENCY Duration: years Previous Vitamin D level: 38  Current supplementation: Vitamin D3 1000 IU daily  URINARY SYMPTOMS Patient reports since she has been drinking more water, she will sometimes have urinary urgency.  If she does not use the bathroom right away, she becomes incontinent.  She tries to use the bathroom right away when she feels the urge and this has helped prevent incontinence. Duration: months Dysuria: no Urinary frequency: yes Urgency: yes Small volume voids: no Symptom severity: mild Urinary incontinence: yes Foul odor: no Hematuria:  no Abdominal pain: no Back pain: no Suprapubic pain/pressure: no Flank pain: no Fever:  no Nausea: no Vomiting: no Status: better  Allergies  Allergen Reactions   Statins Rash    Outpatient Encounter Medications as of 11/19/2021  Medication Sig   Cholecalciferol (VITAMIN D3) 1000 units CAPS Take by mouth.   Dulaglutide (TRULICITY) 1.5 0000000 SOPN Inject 1.5 mg into the skin once a week. Begin after completing Trulicity 0.75mg  x4 weeks.   Multiple Vitamin (MULTIVITAMIN) capsule Take 1 capsule by mouth daily.   [DISCONTINUED] Dulaglutide (TRULICITY) A999333 0000000 SOPN Inject 0.75 mg into the skin once a week. X4 weeks then D/C   No facility-administered encounter medications on file as of 11/19/2021.    Patient Active Problem List   Diagnosis Date Noted   CKD stage 2 due to type 2 diabetes mellitus ([place]) 05/14/2021   Osteoarthritis 04/15/2021   Hyperlipidemia 03/10/2015   Lumbago 10/28/2014   Pain in joint, ankle and foot 02/11/2014   Bunion of great toe 02/11/2014   Diabetes mellitus type II, controlled ([place]) 11/26/2013   Vitamin D deficiency 11/26/2013   Morbid obesity with BMI of 40.0-44.9, adult ([place]) 11/26/2013    Past Medical History:  Diagnosis Date   Diabetes mellitus without complication ([place])    "borderline"   Hyperlipidemia     Relevant past medical, surgical, family and social history reviewed and updated as indicated. Interim medical history since our last visit reviewed.  Review of Systems Per HPI unless specifically indicated above     Objective:    BP 118/70  Pulse 79    Ht 5\' 6"  (1.676 m)    Wt 259 lb (117.5 kg)    SpO2 97%    BMI 41.80 kg/m   Wt Readings from Last 3 Encounters:  11/19/21 259 lb (117.5 kg)  08/14/21 258 lb 6.4 oz (117.2 kg)  05/13/21 267 lb 9.6 oz (121.4 kg)    Physical Exam Vitals and nursing note reviewed.  Constitutional:      General: She is not in acute distress.    Appearance: Normal appearance. She is obese. She  is not toxic-appearing.  Cardiovascular:     Rate and Rhythm: Normal rate and regular rhythm.     Heart sounds: Normal heart sounds. No murmur heard. Pulmonary:     Effort: Pulmonary effort is normal. No respiratory distress.     Breath sounds: Normal breath sounds. No wheezing, rhonchi or rales.  Abdominal:     General: Abdomen is flat. Bowel sounds are normal. There is no distension.     Palpations: Abdomen is soft. There is no mass.     Tenderness: There is no abdominal tenderness.  Skin:    General: Skin is warm and dry.     Coloration: Skin is not jaundiced or pale.     Findings: No erythema.  Neurological:     Mental Status: She is alert and oriented to person, place, and time.     Motor: No weakness.     Gait: Gait normal.  Psychiatric:        Mood and Affect: Mood normal.        Behavior: Behavior normal.        Thought Content: Thought content normal.        Judgment: Judgment normal.      Assessment & Plan:   Problem List Items Addressed This Visit       Endocrine   Diabetes mellitus type II, controlled (HCC)    Chronic.  Goal A1c is less than 7%.  She has been controlled with use of Trulicity.  Should be taking Trulicity 1.5 mg weekly - refills were sent in October.  Foot and eye exam are up to date.  Will check LDL level today, goal less than 70, intolerant to statin therapy.  Consider Zetia if LDL above goal.  Blood pressure is well controlled today in office.  Flu shot and prevnar 20 given today in office.  Continue dietary and lifestyle changes to help with diabetes and weight loss including physical activity and eating a diet low in sugar/carbohydrates.  Follow up with new PCP      Relevant Orders   Hemoglobin A1c   Lipid panel   COMPLETE METABOLIC PANEL WITH GFR   Flu Vaccine QUAD 32mo+IM (Fluarix, Fluzone & Alfiuria Quad PF) (Completed)   Pneumococcal conjugate vaccine 20-valent (Prevnar 20) (Completed)   CKD stage 2 due to type 2 diabetes mellitus (HCC)     Check electrolytes and kidney function today.  Continue avoiding nephrotoxins and NSAIDs        Other   Vitamin D deficiency    Last Vitamin D level normal.  Plan to continue current OTC dosing.      Morbid obesity with BMI of 40.0-44.9, adult (HCC) - Primary    Chronic.  She has kept weight off since last visit; likely has not continued to lose weight because she is on the original prescription of Trulicity 0.75 mg weekly.  We sent in increased dose of 1.5 mg back in October and  I encouraged her to start on this dose.  Continue dietary and lifestyle changes including 30 minutes of physical activity 5 times weekly and eating diet with plenty of fiber and lean meats.        Lumbago    Suspect related to pressure on sacrum from sleeping with HOB elevated.  Encouraged elevating legs as well and sleeping with pillow in between knees.  No red flags - no shooting pain down legs, saddle anesthesia, fevers, etc.       Other Visit Diagnoses     Urinary urgency       Related to drinking more water.  Discussed bladder training.  No other worrisome symptoms.  Check UA to rule out infectious cause today.   Relevant Orders   Urinalysis, Routine w reflex microscopic   Flu Vaccine QUAD 110mo+IM (Fluarix, Fluzone & Alfiuria Quad PF) (Completed)   Pneumococcal conjugate vaccine 20-valent (Prevnar 20) (Completed)   Need for influenza vaccination       Relevant Orders   Flu Vaccine QUAD 25mo+IM (Fluarix, Fluzone & Alfiuria Quad PF) (Completed)   Pneumococcal conjugate vaccine 20-valent (Prevnar 20) (Completed)   Need for pneumococcal vaccination       Relevant Orders   Flu Vaccine QUAD 1mo+IM (Fluarix, Fluzone & Alfiuria Quad PF) (Completed)   Pneumococcal conjugate vaccine 20-valent (Prevnar 20) (Completed)   Screening for colon cancer       Relevant Orders   Ambulatory referral to Gastroenterology        Follow up plan: Return for with new pcp.

## 2021-11-19 NOTE — Assessment & Plan Note (Signed)
Last Vitamin D level normal.  Plan to continue current OTC dosing.

## 2021-11-19 NOTE — Assessment & Plan Note (Signed)
Suspect related to pressure on sacrum from sleeping with HOB elevated.  Encouraged elevating legs as well and sleeping with pillow in between knees.  No red flags - no shooting pain down legs, saddle anesthesia, fevers, etc.

## 2021-11-19 NOTE — Assessment & Plan Note (Signed)
Chronic.  Goal A1c is less than 7%.  She has been controlled with use of Trulicity.  Should be taking Trulicity 1.5 mg weekly - refills were sent in October.  Foot and eye exam are up to date.  Will check LDL level today, goal less than 70, intolerant to statin therapy.  Consider Zetia if LDL above goal.  Blood pressure is well controlled today in office.  Flu shot and prevnar 20 given today in office.  Continue dietary and lifestyle changes to help with diabetes and weight loss including physical activity and eating a diet low in sugar/carbohydrates.  Follow up with new PCP

## 2021-11-19 NOTE — Assessment & Plan Note (Signed)
Chronic.  She has kept weight off since last visit; likely has not continued to lose weight because she is on the original prescription of Trulicity A999333 mg weekly.  We sent in increased dose of 1.5 mg back in October and I encouraged her to start on this dose.  Continue dietary and lifestyle changes including 30 minutes of physical activity 5 times weekly and eating diet with plenty of fiber and lean meats.

## 2021-11-19 NOTE — Assessment & Plan Note (Signed)
Check electrolytes and kidney function today.  Continue avoiding nephrotoxins and NSAIDs

## 2021-11-20 LAB — HEMOGLOBIN A1C
Hgb A1c MFr Bld: 5.9 % of total Hgb — ABNORMAL HIGH (ref <5.7)
Mean Plasma Glucose: 123 mg/dL
eAG (mmol/L): 6.8 mmol/L

## 2021-11-20 LAB — COMPLETE METABOLIC PANEL WITH GFR
AG Ratio: 1.2 (calc) (ref 1.0–2.5)
ALT: 11 U/L (ref 6–29)
AST: 14 U/L (ref 10–35)
Albumin: 3.6 g/dL (ref 3.6–5.1)
Alkaline phosphatase (APISO): 94 U/L (ref 37–153)
BUN: 11 mg/dL (ref 7–25)
CO2: 32 mmol/L (ref 20–32)
Calcium: 9.2 mg/dL (ref 8.6–10.4)
Chloride: 106 mmol/L (ref 98–110)
Creat: 1.01 mg/dL (ref 0.50–1.05)
Globulin: 3 g/dL (calc) (ref 1.9–3.7)
Glucose, Bld: 86 mg/dL (ref 65–99)
Potassium: 5.1 mmol/L (ref 3.5–5.3)
Sodium: 142 mmol/L (ref 135–146)
Total Bilirubin: 0.4 mg/dL (ref 0.2–1.2)
Total Protein: 6.6 g/dL (ref 6.1–8.1)
eGFR: 64 mL/min/{1.73_m2} (ref >=60)

## 2021-11-20 LAB — URINALYSIS, ROUTINE W REFLEX MICROSCOPIC
Bilirubin Urine: NEGATIVE
Glucose, UA: NEGATIVE
Hgb urine dipstick: NEGATIVE
Ketones, ur: NEGATIVE
Leukocytes,Ua: NEGATIVE
Nitrite: NEGATIVE
Protein, ur: NEGATIVE
Specific Gravity, Urine: 1.021 (ref 1.001–1.035)
pH: 6.5 (ref 5.0–8.0)

## 2021-11-20 LAB — LIPID PANEL
Cholesterol: 163 mg/dL (ref <200)
HDL: 53 mg/dL (ref >=50)
LDL Cholesterol (Calc): 97 mg/dL (calc)
Non-HDL Cholesterol (Calc): 110 mg/dL (calc) (ref <130)
Total CHOL/HDL Ratio: 3.1 (calc) (ref <5.0)
Triglycerides: 52 mg/dL (ref <150)

## 2021-12-03 ENCOUNTER — Encounter: Admitting: Nurse Practitioner

## 2021-12-10 ENCOUNTER — Other Ambulatory Visit: Payer: Self-pay | Admitting: Family Medicine

## 2022-01-05 ENCOUNTER — Encounter: Payer: Self-pay | Admitting: Nurse Practitioner

## 2022-01-15 ENCOUNTER — Ambulatory Visit: Admitting: Nurse Practitioner

## 2022-02-22 LAB — HM DIABETES EYE EXAM

## 2022-03-03 ENCOUNTER — Ambulatory Visit (INDEPENDENT_AMBULATORY_CARE_PROVIDER_SITE_OTHER): Admitting: Nurse Practitioner

## 2022-03-03 ENCOUNTER — Encounter: Payer: Self-pay | Admitting: Nurse Practitioner

## 2022-03-03 ENCOUNTER — Encounter: Payer: Self-pay | Admitting: Internal Medicine

## 2022-03-03 ENCOUNTER — Other Ambulatory Visit (HOSPITAL_COMMUNITY): Payer: Self-pay | Admitting: Nurse Practitioner

## 2022-03-03 VITALS — BP 130/82 | HR 61 | Ht 65.0 in | Wt 258.0 lb

## 2022-03-03 DIAGNOSIS — N182 Chronic kidney disease, stage 2 (mild): Secondary | ICD-10-CM

## 2022-03-03 DIAGNOSIS — Z6841 Body Mass Index (BMI) 40.0 and over, adult: Secondary | ICD-10-CM

## 2022-03-03 DIAGNOSIS — E1122 Type 2 diabetes mellitus with diabetic chronic kidney disease: Secondary | ICD-10-CM

## 2022-03-03 DIAGNOSIS — Z1231 Encounter for screening mammogram for malignant neoplasm of breast: Secondary | ICD-10-CM

## 2022-03-03 DIAGNOSIS — Z1211 Encounter for screening for malignant neoplasm of colon: Secondary | ICD-10-CM | POA: Diagnosis not present

## 2022-03-03 DIAGNOSIS — Z23 Encounter for immunization: Secondary | ICD-10-CM

## 2022-03-03 DIAGNOSIS — Z139 Encounter for screening, unspecified: Secondary | ICD-10-CM

## 2022-03-03 DIAGNOSIS — E782 Mixed hyperlipidemia: Secondary | ICD-10-CM | POA: Diagnosis not present

## 2022-03-03 DIAGNOSIS — E559 Vitamin D deficiency, unspecified: Secondary | ICD-10-CM

## 2022-03-03 DIAGNOSIS — H6121 Impacted cerumen, right ear: Secondary | ICD-10-CM

## 2022-03-03 MED ORDER — DEBROX 6.5 % OT SOLN: 5.0000 [drp] | Freq: Two times a day (BID) | OTIC | 0 refills | Status: DC

## 2022-03-03 NOTE — Assessment & Plan Note (Signed)
On vitamin D 1000 units daily ?Recheck vitamin D at next visit ?

## 2022-03-03 NOTE — Assessment & Plan Note (Addendum)
Wt Readings from Last 3 Encounters:  ?03/03/22 258 lb (117 kg)  ?11/19/21 259 lb (117.5 kg)  ?08/14/21 258 lb 6.4 oz (117.2 kg)  ?states that she needs to loose more weight .  ?No weight loss since her last visit. ?Importance of increasing intake of whole foods consisting mainly vegetables protein less carbohydrate drinking at least 64 ounces of water daily, importance of portion control, engaging in daily vigorous exercises at least 150 minutes weekly discussed with patient.  She verbalized understanding ?

## 2022-03-03 NOTE — Assessment & Plan Note (Signed)
Currently on Trulicity 1.5 mg once weekly injection, denies hypoglycemia, nausea or vomiting last A1c was under control, up-to-date with diabetic eye exam, urine creatinine level ordered today we will recheck A1c in 3 months.  ?Not on statin, due to statin intolerance, will check lipid panel before next visit and start on Zetia if LDL not at goal of less than 70. ?Lab Results  ?Component Value Date  ? HGBA1C 5.9 (H) 11/19/2021  ? ?

## 2022-03-03 NOTE — Assessment & Plan Note (Signed)
Patient educated on CDC recommendation for the vaccine. Verbal consent was obtained from the patient, vaccine administered by nurse, no sign of adverse reactions noted at this time. Patient education on arm soreness and use of tylenol  for this patient  was discussed. Patient educated on the signs and symptoms of adverse effect and advise to contact the office if they occur.  ?

## 2022-03-03 NOTE — Patient Instructions (Addendum)
Please get your fasting labs done 3-5 days before your next visit ? ?Please get your shingles vaccine today  ? ?Pleas use debrox ear  wax removal solution two times daily to your right ear  ? ? ?It is important that you exercise regularly at least 30 minutes 5 times a week.  ?Think about what you will eat, plan ahead. ?Choose " clean, green, fresh or frozen" over canned, processed or packaged foods which are more sugary, salty and fatty. ?70 to 75% of food eaten should be vegetables and fruit. ?Three meals at set times with snacks allowed between meals, but they must be fruit or vegetables. ?Aim to eat over a 12 hour period , example 7 am to 7 pm, and STOP after  your last meal of the day. ?Drink water,generally about 64 ounces per day, no other drink is as healthy. Fruit juice is best enjoyed in a healthy way, by EATING the fruit. ? ?Thanks for choosing [facility], we consider it a privelige to serve you.  ?

## 2022-03-03 NOTE — Assessment & Plan Note (Addendum)
Lab Results  ?Component Value Date  ? NA 142 11/19/2021  ? K 5.1 11/19/2021  ? CO2 32 11/19/2021  ? GLUCOSE 86 11/19/2021  ? BUN 11 11/19/2021  ? CREATININE 1.01 11/19/2021  ? CALCIUM 9.2 11/19/2021  ? EGFR 64 11/19/2021  ? GFRNONAA 52 (L) 05/13/2021  ?Avoid ibuprofen, Aleve ?Drink at least 64 ounces of water daily ?On Trulicity injection ?

## 2022-03-03 NOTE — Progress Notes (Signed)
? ?New Patient Office Visit ? ?Subjective   ? ?Patient ID: Angela Bates, female    DOB: [birth year]  Age: 61 y.o. MRN: [id number] ? ?CC:  ?Chief Complaint  ?Patient presents with  ? New Patient (Initial Visit)  ?  NP  ? ? ?HPI ?Angela Bates with past medical history of controlled type 2 diabetes, CKD stage II, vitamin D deficiency, morbid obesity, hyperlipidemia presents to establish care for her chronic medical conditions.  Previous patient of Noemi Chapel, NP ? ?Hyperlipidemia.  Patient was previously on a statin ,states that statin made her itch really bad in the past so she was taken off medication.  ? ?Diabetes type 2.  Currently on Trulicity 1.5 mg once weekly injection, denies hypoglycemia, nausea or vomiting last A1c was under control, up-to-date with diabetic eye exam, urine creatinine level ordered today we will recheck A1c in 3 months.  ? ? ?Pt c/o trouble hearing in her right  ear since about 2-3 weeks , denies pain, era drainage.  ? ? ? ? ?Outpatient Encounter Medications as of 03/03/2022  ?Medication Sig  ? carbamide peroxide (DEBROX) 6.5 % OTIC solution Place 5 drops into the right ear 2 (two) times daily.  ? Cholecalciferol (VITAMIN D3) 1000 units CAPS Take by mouth.  ? Multiple Vitamin (MULTIVITAMIN) capsule Take 1 capsule by mouth daily.  ? TRULICITY 1.5 MA/2.6JF SOPN INJECT 1.5 MG UNDER THE SKIN ONCE A WEEK. BEGIN AFTER COMPLETING TRULICITY 3.54 MG FOR 4 WEEKS  ? ibuprofen (ADVIL) 800 MG tablet Take 800 mg by mouth. (Patient not taking: Reported on 03/03/2022)  ? ?No facility-administered encounter medications on file as of 03/03/2022.  ? ? ?Past Medical History:  ?Diagnosis Date  ? Diabetes mellitus without complication ([place])   ? "borderline"  ? Hyperlipidemia   ? ? ?Past Surgical History:  ?Procedure Laterality Date  ? ABDOMINAL HYSTERECTOMY  2002  ? ? ?Family History  ?Problem Relation Age of Onset  ? Cancer Father   ? Hypertension Mother   ? Cancer Brother   ?     Leukemia  ? ? ?Social  History  ? ?Socioeconomic History  ? Marital status: Widowed  ?  Spouse name: Not on file  ? Number of children: 3  ? Years of education: Not on file  ? Highest education level: Not on file  ?Occupational History  ? Not on file  ?Tobacco Use  ? Smoking status: Former  ? Smokeless tobacco: Never  ? Tobacco comments:  ?  quit >10 years prior  ?Vaping Use  ? Vaping Use: Never used  ?Substance and Sexual Activity  ? Alcohol use: No  ? Drug use: Never  ? Sexual activity: Not Currently  ?Other Topics Concern  ? Not on file  ?Social History Narrative  ? Lives with her mother, works at [employer].   ? ?Social Determinants of Health  ? ?Financial Resource Strain: Not on file  ?Food Insecurity: Not on file  ?Transportation Needs: Not on file  ?Physical Activity: Not on file  ?Stress: Not on file  ?Social Connections: Not on file  ?Intimate Partner Violence: Not on file  ? ? ?Review of Systems  ?Constitutional: Negative.   ?HENT:  Positive for hearing loss. Negative for congestion, ear discharge, ear pain, nosebleeds, sinus pain, sore throat and tinnitus.   ?Eyes: Negative.   ?Respiratory: Negative.  Negative for stridor.   ?Cardiovascular: Negative.   ?Gastrointestinal: Negative.   ?Psychiatric/Behavioral: Negative.    ? ?  ? ? ?  Objective   ? ?BP 130/82 (BP Location: Right Arm, Cuff Size: Large)   Pulse 61   Ht $R'5\' 5"'lk$  (1.651 m)   Wt 258 lb (117 kg)   SpO2 99%   BMI 42.93 kg/m?  ? ?Physical Exam ?Constitutional:   ?   General: She is not in acute distress. ?   Appearance: She is obese. She is not ill-appearing, toxic-appearing or diaphoretic.  ?HENT:  ?   Head: Normocephalic and atraumatic.  ?   Right Ear: Tympanic membrane, ear canal and external ear normal. There is impacted cerumen.  ?   Left Ear: Tympanic membrane, ear canal and external ear normal. There is no impacted cerumen.  ?   Nose: No congestion or rhinorrhea.  ?   Mouth/Throat:  ?   Mouth: Mucous membranes are moist.  ?   Pharynx: Oropharynx  is clear. No oropharyngeal exudate or posterior oropharyngeal erythema.  ?Eyes:  ?   General: No scleral icterus.    ?   Right eye: No discharge.     ?   Left eye: No discharge.  ?   Extraocular Movements: Extraocular movements intact.  ?   Conjunctiva/sclera: Conjunctivae normal.  ?Cardiovascular:  ?   Rate and Rhythm: Normal rate and regular rhythm.  ?   Pulses: Normal pulses.  ?   Heart sounds: Normal heart sounds. No murmur heard. ?  No friction rub. No gallop.  ?Pulmonary:  ?   Effort: Pulmonary effort is normal. No respiratory distress.  ?   Breath sounds: Normal breath sounds. No stridor. No wheezing, rhonchi or rales.  ?Chest:  ?   Chest wall: No tenderness.  ?Abdominal:  ?   Palpations: Abdomen is soft.  ?Musculoskeletal:  ?   Right lower leg: No edema.  ?   Left lower leg: No edema.  ?Neurological:  ?   Mental Status: She is alert and oriented to person, place, and time.  ?Psychiatric:     ?   Mood and Affect: Mood normal.     ?   Behavior: Behavior normal.     ?   Thought Content: Thought content normal.     ?   Judgment: Judgment normal.  ? ? ? ?  ? ?Assessment & Plan:  ? ?Problem List Items Addressed This Visit   ? ?  ? Endocrine  ? Diabetes mellitus type II, controlled ([place]) - Primary  ?  Currently on Trulicity 1.5 mg once weekly injection, denies hypoglycemia, nausea or vomiting last A1c was under control, up-to-date with diabetic eye exam, urine creatinine level ordered today we will recheck A1c in 3 months.  ?Not on statin, due to statin intolerance, will check lipid panel before next visit and start on Zetia if LDL not at goal of less than 70. ?Lab Results  ?Component Value Date  ? HGBA1C 5.9 (H) 11/19/2021  ?  ?  ? Relevant Orders  ? HgB A1c  ? Lipid Profile  ? CMP14+EGFR  ? CBC with Differential  ? Urine Albumin-Creatinine with uACR  ? CKD stage 2 due to type 2 diabetes mellitus ([place])  ?  Lab Results  ?Component Value Date  ? NA 142 11/19/2021  ? K 5.1 11/19/2021  ? CO2 32 11/19/2021  ? GLUCOSE  86 11/19/2021  ? BUN 11 11/19/2021  ? CREATININE 1.01 11/19/2021  ? CALCIUM 9.2 11/19/2021  ? EGFR 64 11/19/2021  ? GFRNONAA 52 (L) 05/13/2021  ?Avoid ibuprofen, Aleve ?Drink at least 64 ounces of water  daily ?On Trulicity injection ?  ?  ? Relevant Orders  ? CMP14+EGFR  ?  ? Nervous and Auditory  ? Impacted cerumen of right ear  ?  Right ear flushed today ?Use Debrox ear wax removal twice daily as needed. ? ?  ?  ? Relevant Medications  ? carbamide peroxide (DEBROX) 6.5 % OTIC solution  ?  ? Other  ? Vitamin D deficiency  ?  On vitamin D 1000 units daily ?Recheck vitamin D at next visit ? ?  ?  ? Relevant Orders  ? VITAMIN D 25 Hydroxy (Vit-D Deficiency, Fractures)  ? Morbid obesity with BMI of 40.0-44.9, adult ([place])  ?  Wt Readings from Last 3 Encounters:  ?03/03/22 258 lb (117 kg)  ?11/19/21 259 lb (117.5 kg)  ?08/14/21 258 lb 6.4 oz (117.2 kg)  ?states that she needs to loose more weight .  ?No weight loss since her last visit. ?Importance of increasing intake of whole foods consisting mainly vegetables protein less carbohydrate drinking at least 64 ounces of water daily, importance of portion control, engaging in daily vigorous exercises at least 150 minutes weekly discussed with patient.  She verbalized understanding ?  ?  ? Hyperlipidemia  ?  Not currently on atorvastatin states that it really made her itchy and she was told to stop taking medication ?We will check lipid panel at next visit and start on Zetia if LDL is not less than 70. ?Avoid fatty fried food ?Lab Results  ?Component Value Date  ? CHOL 163 11/19/2021  ? HDL 53 11/19/2021  ? [place] 97 11/19/2021  ? TRIG 52 11/19/2021  ? CHOLHDL 3.1 11/19/2021  ? ?  ?  ? Relevant Orders  ? Lipid Profile  ? Need for varicella vaccine  ?  Patient educated on CDC recommendation for the vaccine. Verbal consent was obtained from the patient, vaccine administered by nurse, no sign of adverse reactions noted at this time. Patient education on arm soreness and use of  tylenol for this patient  was discussed. Patient educated on the signs and symptoms of adverse effect and advise to contact the office if they occur..  ? ?  ?  ? Relevant Orders  ? Varicella-zoster vaccine IM

## 2022-03-03 NOTE — Assessment & Plan Note (Addendum)
Not currently on atorvastatin states that it really made her itchy and she was told to stop taking medication ?We will check lipid panel at next visit and start on Zetia if LDL is not less than 70. ?Avoid fatty fried food ?Lab Results  ?Component Value Date  ? CHOL 163 11/19/2021  ? HDL 53 11/19/2021  ? [place] 97 11/19/2021  ? TRIG 52 11/19/2021  ? CHOLHDL 3.1 11/19/2021  ? ?

## 2022-03-03 NOTE — Assessment & Plan Note (Signed)
Right ear flushed today ?Use Debrox ear wax removal twice daily as needed. ?

## 2022-03-08 ENCOUNTER — Ambulatory Visit (HOSPITAL_COMMUNITY)
Admission: RE | Admit: 2022-03-08 | Discharge: 2022-03-08 | Disposition: A | Discharge status: Home / Self Care | Source: Ambulatory Visit | Attending: Nurse Practitioner | Admitting: Nurse Practitioner

## 2022-03-08 DIAGNOSIS — Z1231 Encounter for screening mammogram for malignant neoplasm of breast: Secondary | ICD-10-CM | POA: Diagnosis present

## 2022-04-07 ENCOUNTER — Other Ambulatory Visit: Payer: Self-pay | Admitting: Nurse Practitioner

## 2022-04-07 ENCOUNTER — Telehealth: Payer: Self-pay

## 2022-04-07 MED ORDER — TRULICITY 1.5 MG/0.5ML ~~LOC~~ SOAJ: SUBCUTANEOUS | 12 refills | Status: DC

## 2022-04-07 NOTE — Telephone Encounter (Signed)
Please advise 

## 2022-04-07 NOTE — Telephone Encounter (Signed)
Patient called only has one more to do, need to update the dosage  TRULICITY 1.5 0000000 [place], [place]  [address] [place] [address]  Phone:  [phone]  Fax:  [phone]

## 2022-04-08 ENCOUNTER — Other Ambulatory Visit: Payer: Self-pay

## 2022-04-08 MED ORDER — TRULICITY 1.5 MG/0.5ML ~~LOC~~ SOAJ: SUBCUTANEOUS | 12 refills | Status: DC

## 2022-04-08 NOTE — Telephone Encounter (Signed)
Patient returning call.

## 2022-04-08 NOTE — Telephone Encounter (Signed)
Called pt no answer left vm 

## 2022-04-08 NOTE — Telephone Encounter (Signed)
Spoke with pt advised of fola's message pt verbalized understanding ?

## 2022-04-14 ENCOUNTER — Encounter: Payer: Self-pay | Admitting: *Deleted

## 2022-04-30 ENCOUNTER — Other Ambulatory Visit: Payer: Self-pay | Admitting: Nurse Practitioner

## 2022-05-20 ENCOUNTER — Ambulatory Visit

## 2022-06-10 ENCOUNTER — Ambulatory Visit (INDEPENDENT_AMBULATORY_CARE_PROVIDER_SITE_OTHER): Admitting: Nurse Practitioner

## 2022-06-10 ENCOUNTER — Encounter: Payer: Self-pay | Admitting: Nurse Practitioner

## 2022-06-10 VITALS — BP 127/70 | HR 66 | Ht 66.0 in | Wt 262.0 lb

## 2022-06-10 DIAGNOSIS — N182 Chronic kidney disease, stage 2 (mild): Secondary | ICD-10-CM

## 2022-06-10 DIAGNOSIS — Z0001 Encounter for general adult medical examination with abnormal findings: Secondary | ICD-10-CM | POA: Diagnosis not present

## 2022-06-10 DIAGNOSIS — Z23 Encounter for immunization: Secondary | ICD-10-CM | POA: Diagnosis not present

## 2022-06-10 DIAGNOSIS — E1122 Type 2 diabetes mellitus with diabetic chronic kidney disease: Secondary | ICD-10-CM

## 2022-06-10 DIAGNOSIS — Z0000 Encounter for general adult medical examination without abnormal findings: Secondary | ICD-10-CM | POA: Insufficient documentation

## 2022-06-10 NOTE — Assessment & Plan Note (Signed)
Patient educated on CDC recommendation for theshingles vaccine. Verbal consent was obtained from the patient, vaccine administered by nurse, no sign of adverse reactions noted at this time. Patient education on arm soreness and use of tylenol  for this patient  was discussed. Patient educated on the signs and symptoms of adverse effect and advise to contact the office if they occur.  

## 2022-06-10 NOTE — Assessment & Plan Note (Addendum)
Lab Results  Component Value Date   HGBA1C 5.9 (H) 11/19/2021   BP Readings from Last 3 Encounters:  06/10/22 127/70  03/03/22 130/82  11/19/21 118/70   Chronic condition well-controlled on Trulicity 1.5 mg once weekly injection Check A1c, urinemicro albumin labs Not on a statin due to statin intolerance we will check lipid panel and start patient on Zetia if her LDL is not below 70 Not on ACE or ARB but her blood pressure is under control.  Will consider starting patient on ACE or ARB if her blood pressure is above 130/80

## 2022-06-10 NOTE — Assessment & Plan Note (Signed)
Annual exam as documented.  Counseling done include healthy lifestyle involving committing to 150 minutes of exercise per week, heart healthy diet, and attaining healthy weight. The importance of adequate sleep also discussed.  Regular use of seat belt and home safety were also discussed . Changes in health habits are decided on by patient with goals and time frames set for achieving them. Immunization and cancer screening  needs are specifically addressed at this visit.  Dose vaccine given today. Still waiting on the schedule for colonscopy .

## 2022-06-10 NOTE — Progress Notes (Signed)
Complete physical exam  Patient: Angela Bates   DOB: 01/01/1961   61 y.o. Female  MRN: [id number]  Subjective:    Chief Complaint  Patient presents with   Annual Exam    cpe    Katty Fretwell is a 61 y.o. female who presents today for a complete physical exam. She reports consuming a low fat diet she eats  mostly vegetable,low carb foodsShe generally feels fairly well. She reports sleeping well. She does not have additional problems to discuss today.      Most recent fall risk assessment:    06/10/2022    8:27 AM  Fall Risk   Falls in the past year? 0  Number falls in past yr: 0  Injury with Fall? 0  Risk for fall due to : No Fall Risks  Follow up Falls evaluation completed     Most recent depression screenings:    06/10/2022    8:27 AM 03/03/2022    8:30 AM  PHQ 2/9 Scores  PHQ - 2 Score 0 0        Patient Care Team: Donell Beers, FNP as PCP - General (Nurse Practitioner)   Outpatient Medications Prior to Visit  Medication Sig   Cholecalciferol (VITAMIN D3) 1000 units CAPS Take by mouth.   Dulaglutide (TRULICITY) 1.5 MG/0.5ML SOPN INJECT 1.5 MG UNDER THE SKIN ONCE A WEEK. BEGIN AFTER COMPLETING TRULICITY 0.75 MG FOR 4 WEEKS   Multiple Vitamin (MULTIVITAMIN) capsule Take 1 capsule by mouth daily.   carbamide peroxide (DEBROX) 6.5 % OTIC solution Place 5 drops into the right ear 2 (two) times daily. (Patient not taking: Reported on 06/10/2022)   ibuprofen (ADVIL) 800 MG tablet Take 800 mg by mouth. (Patient not taking: Reported on 03/03/2022)   No facility-administered medications prior to visit.    Review of Systems  Constitutional: Negative.  Negative for chills, fever and weight loss.  HENT: Negative.  Negative for ear discharge, ear pain, hearing loss and tinnitus.   Eyes: Negative.  Negative for blurred vision, double vision and photophobia.  Respiratory: Negative.  Negative for cough, hemoptysis, sputum production and shortness of breath.    Cardiovascular: Negative.  Negative for chest pain, palpitations and orthopnea.  Gastrointestinal: Negative.  Negative for abdominal pain, diarrhea, heartburn, nausea and vomiting.  Genitourinary: Negative.  Negative for dysuria, frequency and urgency.  Musculoskeletal: Negative.  Negative for back pain, joint pain, myalgias and neck pain.  Skin: Negative.  Negative for itching and rash.  Neurological: Negative.  Negative for dizziness, tingling, tremors, speech change and headaches.  Endo/Heme/Allergies: Negative.  Negative for environmental allergies and polydipsia. Does not bruise/bleed easily.  Psychiatric/Behavioral: Negative.  Negative for depression, hallucinations, substance abuse and suicidal ideas.           Objective:     BP 127/70 (BP Location: Right Arm, Patient Position: Sitting, Cuff Size: Large)   Pulse 66   Ht 5\' 6"  (1.676 m)   Wt 262 lb (118.8 kg)   SpO2 96%   BMI 42.29 kg/m    Physical Exam Constitutional:      General: She is not in acute distress.    Appearance: She is obese. She is not ill-appearing, toxic-appearing or diaphoretic.  HENT:     Head: Normocephalic and atraumatic.     Right Ear: Tympanic membrane, ear canal and external ear normal. There is no impacted cerumen.     Left Ear: Tympanic membrane, ear canal and external ear normal. There is no impacted  cerumen.     Nose: No congestion or rhinorrhea.     Mouth/Throat:     Mouth: Mucous membranes are moist.     Pharynx: Oropharynx is clear. No oropharyngeal exudate or posterior oropharyngeal erythema.  Eyes:     General: No scleral icterus.       Right eye: No discharge.        Left eye: No discharge.     Extraocular Movements: Extraocular movements intact.     Conjunctiva/sclera: Conjunctivae normal.     Pupils: Pupils are equal, round, and reactive to light.  Neck:     Vascular: No carotid bruit.  Cardiovascular:     Rate and Rhythm: Normal rate and regular rhythm.     Pulses: Normal  pulses.     Heart sounds: Normal heart sounds. No murmur heard.    No friction rub. No gallop.  Pulmonary:     Effort: Pulmonary effort is normal. No respiratory distress.     Breath sounds: Normal breath sounds. No stridor. No wheezing, rhonchi or rales.  Chest:     Chest wall: No tenderness.  Abdominal:     General: There is no distension.     Palpations: Abdomen is soft. There is no mass.     Tenderness: There is no abdominal tenderness. There is no right CVA tenderness, left CVA tenderness, guarding or rebound.     Hernia: No hernia is present.  Musculoskeletal:        General: No swelling, tenderness, deformity or signs of injury. Normal range of motion.     Cervical back: Normal range of motion and neck supple. No rigidity or tenderness.     Right lower leg: No edema.     Left lower leg: No edema.  Lymphadenopathy:     Cervical: No cervical adenopathy.  Skin:    General: Skin is warm and dry.     Capillary Refill: Capillary refill takes less than 2 seconds.     Coloration: Skin is not jaundiced or pale.     Findings: No bruising, erythema, lesion or rash.  Neurological:     Mental Status: She is alert and oriented to person, place, and time.     Cranial Nerves: No cranial nerve deficit.     Sensory: No sensory deficit.     Motor: No weakness.     Coordination: Coordination normal.     Gait: Gait normal.     Deep Tendon Reflexes: Reflexes normal.  Psychiatric:        Mood and Affect: Mood normal.        Thought Content: Thought content normal.        Judgment: Judgment normal.      No results found for any visits on 06/10/22.     Assessment & Plan:    Routine Health Maintenance and Physical Exam  Immunization History  Administered Date(s) Administered   Influenza,inj,Quad PF,6+ Mos 11/26/2013, 10/28/2014, 09/10/2015, 11/19/2021   PFIZER(Purple Top)SARS-COV-2 Vaccination 01/31/2020, 02/28/2020   PNEUMOCOCCAL CONJUGATE-20 11/19/2021   PPD Test 05/05/2015    Pneumococcal Polysaccharide-23 02/25/2014   Tdap 11/26/2013   Zoster Recombinat (Shingrix) 03/03/2022, 06/10/2022    Health Maintenance  Topic Date Due   COLONOSCOPY (Pts 45-32yrs Insurance coverage will need to be confirmed)  08/05/2021   HEMOGLOBIN A1C  05/19/2022   INFLUENZA VACCINE  06/08/2022   OPHTHALMOLOGY EXAM  02/23/2023   MAMMOGRAM  03/09/2023   TETANUS/TDAP  11/27/2023   Hepatitis C Screening  Completed   HIV  Screening  Completed   Zoster Vaccines- Shingrix  Completed   HPV VACCINES  Aged Out   FOOT EXAM  Discontinued   URINE MICROALBUMIN  Discontinued   COVID-19 Vaccine  Discontinued    Discussed health benefits of physical activity, and encouraged her to engage in regular exercise appropriate for her age and condition.  Problem List Items Addressed This Visit       Endocrine   Diabetes mellitus type II, controlled ([facility])    Lab Results  Component Value Date   HGBA1C 5.9 (H) 11/19/2021   BP Readings from Last 3 Encounters:  06/10/22 127/70  03/03/22 130/82  11/19/21 118/70  Chronic condition well-controlled on Trulicity 1.5 mg once weekly injection Check A1c, urinemicro albumin labs Not on a statin due to statin intolerance we will check lipid panel and start patient on Zetia if her LDL is not below 70 Not on ACE or ARB but her blood pressure is under control.  Will consider starting patient on ACE or ARB if her blood pressure is above 130/80      Relevant Orders   HgB A1c   Urine Microalbumin w/creat. ratio     Other   Need for varicella vaccine    Patient educated on CDC recommendation for the shingles vaccine. Verbal consent was obtained from the patient, vaccine administered by nurse, no sign of adverse reactions noted at this time. Patient education on arm soreness and use of tylenol for this patient  was discussed. Patient educated on the signs and symptoms of adverse effect and advise to contact the office if they occur      Relevant Orders    Varicella-zoster vaccine IM (Completed)   Annual physical exam - Primary    Annual exam as documented.  Counseling done include healthy lifestyle involving committing to 150 minutes of exercise per week, heart healthy diet, and attaining healthy weight. The importance of adequate sleep also discussed.  Regular use of seat belt and home safety were also discussed . Changes in health habits are decided on by patient with goals and time frames set for achieving them. Immunization and cancer screening  needs are specifically addressed at this visit.  Dose vaccine given today. Still waiting on the schedule for colonscopy .       Relevant Orders   Lipid Profile   TSH   CBC with Differential   Vitamin D (25 hydroxy)   Return in about 6 months (around 12/11/2022) for Diabetes. Donell Beers, FNP

## 2022-06-10 NOTE — Patient Instructions (Signed)
Fasting labs today Second dose of shingles vaccine today     It is important that you exercise regularly at least 30 minutes 5 times a week.  Think about what you will eat, plan ahead. Choose " clean, green, fresh or frozen" over canned, processed or packaged foods which are more sugary, salty and fatty. 70 to 75% of food eaten should be vegetables and fruit. Three meals at set times with snacks allowed between meals, but they must be fruit or vegetables. Aim to eat over a 12 hour period , example 7 am to 7 pm, and STOP after  your last meal of the day. Drink water,generally about 64 ounces per day, no other drink is as healthy. Fruit juice is best enjoyed in a healthy way, by EATING the fruit.  Thanks for choosing [facility], we consider it a privelige to serve you.

## 2022-06-11 ENCOUNTER — Other Ambulatory Visit: Payer: Self-pay | Admitting: Nurse Practitioner

## 2022-06-11 DIAGNOSIS — R718 Other abnormality of red blood cells: Secondary | ICD-10-CM

## 2022-06-11 DIAGNOSIS — E1169 Type 2 diabetes mellitus with other specified complication: Secondary | ICD-10-CM

## 2022-06-11 MED ORDER — EZETIMIBE 10 MG PO TABS: 10.0000 mg | ORAL_TABLET | Freq: Every day | ORAL | 3 refills | Status: DC

## 2022-06-11 NOTE — Progress Notes (Signed)
Start zetia 10mg  daily for hyperlipidemia. Avoid fatty fried foods   I have added on iron panel to her labs    Follow up in 8 weeks for hyperlipidemia  A1C remains stable

## 2022-06-12 ENCOUNTER — Other Ambulatory Visit: Payer: Self-pay | Admitting: Nurse Practitioner

## 2022-06-12 DIAGNOSIS — E1169 Type 2 diabetes mellitus with other specified complication: Secondary | ICD-10-CM

## 2022-06-12 DIAGNOSIS — E611 Iron deficiency: Secondary | ICD-10-CM

## 2022-06-12 LAB — SPECIMEN STATUS REPORT

## 2022-06-12 LAB — B12 AND FOLATE PANEL
Folate: 15.1 ng/mL (ref >3.0)
Vitamin B-12: 863 pg/mL (ref 232–1245)

## 2022-06-12 LAB — MICROALBUMIN / CREATININE URINE RATIO
Creatinine, Urine: 193.6 mg/dL
Microalb/Creat Ratio: <2 mg/g creat (ref 0–29)
Microalbumin, Urine: <3 ug/mL

## 2022-06-12 LAB — VITAMIN D 25 HYDROXY (VIT D DEFICIENCY, FRACTURES): Vit D, 25-Hydroxy: 44.3 ng/mL (ref 30.0–100.0)

## 2022-06-12 LAB — IRON,TIBC AND FERRITIN PANEL
Ferritin: 111 ng/mL (ref 15–150)
Iron Saturation: 11 % — ABNORMAL LOW (ref 15–55)
Iron: 34 ug/dL (ref 27–139)
Total Iron Binding Capacity: 318 ug/dL (ref 250–450)
UIBC: 284 ug/dL (ref 118–369)

## 2022-06-12 LAB — HEMOGLOBIN A1C
Est. average glucose Bld gHb Est-mCnc: 123 mg/dL
Hgb A1c MFr Bld: 5.9 % — ABNORMAL HIGH (ref 4.8–5.6)

## 2022-06-12 LAB — LIPID PANEL
Chol/HDL Ratio: 3.4 ratio (ref 0.0–4.4)
Cholesterol, Total: 195 mg/dL (ref 100–199)
HDL: 57 mg/dL (ref >39)
LDL Chol Calc (NIH): 122 mg/dL — ABNORMAL HIGH (ref 0–99)
Triglycerides: 89 mg/dL (ref 0–149)
VLDL Cholesterol Cal: 16 mg/dL (ref 5–40)

## 2022-06-12 LAB — CBC WITH DIFFERENTIAL/PLATELET
Basophils Absolute: 0 10*3/uL (ref 0.0–0.2)
Basos: 0 %
EOS (ABSOLUTE): 0.1 10*3/uL (ref 0.0–0.4)
Eos: 1 %
Hematocrit: 40.9 % (ref 34.0–46.6)
Hemoglobin: 12.9 g/dL (ref 11.1–15.9)
Immature Grans (Abs): 0 10*3/uL (ref 0.0–0.1)
Immature Granulocytes: 0 %
Lymphocytes Absolute: 3.7 10*3/uL — ABNORMAL HIGH (ref 0.7–3.1)
Lymphs: 31 %
MCH: 23.2 pg — ABNORMAL LOW (ref 26.6–33.0)
MCHC: 31.5 g/dL (ref 31.5–35.7)
MCV: 73 fL — ABNORMAL LOW (ref 79–97)
Monocytes Absolute: 0.7 10*3/uL (ref 0.1–0.9)
Monocytes: 6 %
Neutrophils Absolute: 7.3 10*3/uL — ABNORMAL HIGH (ref 1.4–7.0)
Neutrophils: 62 %
Platelets: 172 10*3/uL (ref 150–450)
RBC: 5.57 x10E6/uL — ABNORMAL HIGH (ref 3.77–5.28)
RDW: 16.7 % — ABNORMAL HIGH (ref 11.7–15.4)
WBC: 11.9 10*3/uL — ABNORMAL HIGH (ref 3.4–10.8)

## 2022-06-12 LAB — TSH: TSH: 3.26 u[IU]/mL (ref 0.450–4.500)

## 2022-06-12 MED ORDER — FERROUS SULFATE 325 (65 FE) MG PO TABS: 325.0000 mg | ORAL_TABLET | Freq: Every day | ORAL | 0 refills | Status: AC

## 2022-06-12 NOTE — Progress Notes (Signed)
Iron deficiency anemia, start taking ferous sulfate 325mg  daily/  Fasting Lipid panel 3-5 days before upcoming appointment

## 2022-08-02 ENCOUNTER — Ambulatory Visit: Admitting: Family Medicine

## 2022-08-03 ENCOUNTER — Encounter: Payer: Self-pay | Admitting: Family Medicine

## 2022-08-03 ENCOUNTER — Ambulatory Visit: Admitting: Internal Medicine

## 2022-08-03 ENCOUNTER — Telehealth: Payer: Self-pay

## 2022-08-03 ENCOUNTER — Ambulatory Visit: Admitting: Family Medicine

## 2022-08-03 VITALS — BP 132/70 | HR 100 | Ht 65.0 in | Wt 260.0 lb

## 2022-08-03 DIAGNOSIS — J069 Acute upper respiratory infection, unspecified: Secondary | ICD-10-CM | POA: Diagnosis not present

## 2022-08-03 DIAGNOSIS — U071 COVID-19: Secondary | ICD-10-CM | POA: Diagnosis not present

## 2022-08-03 MED ORDER — NOREL AD 4-10-325 MG PO TABS: ORAL_TABLET | ORAL | 0 refills | Status: DC

## 2022-08-03 NOTE — Patient Instructions (Addendum)
I appreciate the opportunity to provide care to you today!    Follow up: 12/14/2022  Please pick up your mediation at the pharmacy    Please continue to a heart-healthy diet and increase your physical activities. Try to exercise for 5mins at least three times a week.      It was a pleasure to see you and I look forward to continuing to work together on your health and well-being. Please do not hesitate to call the office if you need care or have questions about your care.   Have a wonderful day and week. With Gratitude, Alvira Monday MSN, FNP-BC

## 2022-08-03 NOTE — Assessment & Plan Note (Signed)
Will be treated with Norel-AD Encouraged to take every 4 hours Advised not to take more than six tablets in 24 hours Recommend saline nasal spray and warm salt gargles Pending COVID and flu results

## 2022-08-03 NOTE — Telephone Encounter (Signed)
Patient called need a note to be out of work today and tomorrow since meds not ready til after 3:00 today. Once letter is ready please contact patient to pick up [phone]. 

## 2022-08-03 NOTE — Telephone Encounter (Signed)
Sure, write her the note

## 2022-08-03 NOTE — Progress Notes (Signed)
Established Patient Office Visit  Subjective:  Patient ID: Angela Bates, female    DOB: Nov 10, 1960  Age: 61 y.o. MRN: [id number]  CC:  Chief Complaint  Patient presents with   Nasal Congestion   Sore Throat   Cough    Started feeling sick on  08/01/22 having body pains and feeling dizzy .pain in knee and some in throat today.    HPI Angela Bates is a 61 y.o. female with past medical history of T2DM, CKD stage 2 due to type 2 diabetes mellitus, presents with c/o nasal congestion, fatigue,sore throat, sinus pressure,  body aches, and cough. She denies fever, chills, sneezing, and rhinorrhea. She has not had a Covid test.   Past Medical History:  Diagnosis Date   Diabetes mellitus without complication ([place])    "borderline"   Hyperlipidemia     Past Surgical History:  Procedure Laterality Date   ABDOMINAL HYSTERECTOMY  2002    Family History  Problem Relation Age of Onset   Cancer Father    Hypertension Mother    Cancer Brother        Leukemia    Social History   Socioeconomic History   Marital status: Widowed    Spouse name: Not on file   Number of children: 3   Years of education: Not on file   Highest education level: Not on file  Occupational History   Not on file  Tobacco Use   Smoking status: Former   Smokeless tobacco: Never   Tobacco comments:    quit >10 years prior  Vaping Use   Vaping Use: Never used  Substance and Sexual Activity   Alcohol use: No   Drug use: Never   Sexual activity: Not Currently  Other Topics Concern   Not on file  Social History Narrative   Lives with her mother, works at [employer].    Social Determinants of Health   Financial Resource Strain: Not on file  Food Insecurity: Not on file  Transportation Needs: Not on file  Physical Activity: Not on file  Stress: Not on file  Social Connections: Not on file  Intimate Partner Violence: Not on file    Outpatient Medications Prior to Visit   Medication Sig Dispense Refill   Cholecalciferol (VITAMIN D3) 1000 units CAPS Take by mouth.     Dulaglutide (TRULICITY) 1.5 BD/5.3GD SOPN INJECT 1.5 MG UNDER THE SKIN ONCE A WEEK. BEGIN AFTER COMPLETING TRULICITY 9.24 MG FOR 4 WEEKS 2 mL 12   ezetimibe (ZETIA) 10 MG tablet Take 1 tablet (10 mg total) by mouth daily. 90 tablet 3   ferrous sulfate 325 (65 FE) MG tablet Take 1 tablet (325 mg total) by mouth daily with breakfast. 90 tablet 0   Multiple Vitamin (MULTIVITAMIN) capsule Take 1 capsule by mouth daily.     carbamide peroxide (DEBROX) 6.5 % OTIC solution Place 5 drops into the right ear 2 (two) times daily. (Patient not taking: Reported on 06/10/2022) 15 mL 0   ibuprofen (ADVIL) 800 MG tablet Take 800 mg by mouth. (Patient not taking: Reported on 03/03/2022)     No facility-administered medications prior to visit.    Allergies  Allergen Reactions   Statins Rash    ROS Review of Systems  Constitutional:  Positive for fatigue. Negative for chills and fever.  HENT:  Positive for congestion, rhinorrhea, sinus pressure and sore throat. Negative for sinus pain.   Respiratory:  Negative for cough, chest tightness and shortness of  breath.   Cardiovascular:  Negative for chest pain and palpitations.  Gastrointestinal:  Negative for diarrhea, nausea and vomiting.      Objective:    Physical Exam HENT:     Head: Normocephalic.     Nose: Congestion present. No rhinorrhea.     Mouth/Throat:     Mouth: Mucous membranes are moist.  Eyes:     Pupils: Pupils are equal, round, and reactive to light.  Cardiovascular:     Rate and Rhythm: Normal rate and regular rhythm.  Neurological:     Mental Status: She is alert and oriented to person, place, and time.     BP 132/70 (BP Location: Right Arm, Patient Position: Sitting)   Pulse 100   Ht $R'5\' 5"'uD$  (1.651 m)   Wt 260 lb (117.9 kg)   SpO2 100%   BMI 43.27 kg/m  Wt Readings from Last 3 Encounters:  08/03/22 260 lb (117.9 kg)  06/10/22  262 lb (118.8 kg)  03/03/22 258 lb (117 kg)    Lab Results  Component Value Date   TSH 3.260 06/10/2022   Lab Results  Component Value Date   WBC 11.9 (H) 06/10/2022   HGB 12.9 06/10/2022   HCT 40.9 06/10/2022   MCV 73 (L) 06/10/2022   PLT 172 06/10/2022   Lab Results  Component Value Date   NA 142 11/19/2021   K 5.1 11/19/2021   CO2 32 11/19/2021   GLUCOSE 86 11/19/2021   BUN 11 11/19/2021   CREATININE 1.01 11/19/2021   BILITOT 0.4 11/19/2021   ALKPHOS 88 01/24/2017   AST 14 11/19/2021   ALT 11 11/19/2021   PROT 6.6 11/19/2021   ALBUMIN 3.8 01/24/2017   CALCIUM 9.2 11/19/2021   EGFR 64 11/19/2021   Lab Results  Component Value Date   CHOL 195 06/10/2022   Lab Results  Component Value Date   HDL 57 06/10/2022   Lab Results  Component Value Date   LDLCALC 122 (H) 06/10/2022   Lab Results  Component Value Date   TRIG 89 06/10/2022   Lab Results  Component Value Date   CHOLHDL 3.4 06/10/2022   Lab Results  Component Value Date   HGBA1C 5.9 (H) 06/10/2022      Assessment & Plan:   Problem List Items Addressed This Visit       Respiratory   URI (upper respiratory infection)    Will be treated with Norel-AD Encouraged to take every 4 hours Advised not to take more than six tablets in 24 hours Recommend saline nasal spray and warm salt gargles Pending COVID and flu results      Other Visit Diagnoses     URI, acute    -  Primary   Relevant Medications   Chlorphen-PE-Acetaminophen (NOREL AD) 4-10-325 MG TABS   COVID-19       Relevant Orders   COVID-19, Flu A+B and RSV   Novel Coronavirus, NAA ([facility])       Meds ordered this encounter  Medications   Chlorphen-PE-Acetaminophen (NOREL AD) 4-10-325 MG TABS    Sig: Take 1 tablet every 4 hours, while symptoms persist. Do no take more than 6 tablets in 24 hrs.    Dispense:  30 tablet    Refill:  0    Follow-up: Return if symptoms worsen or fail to improve.    Alvira Monday, FNP

## 2022-08-03 NOTE — Telephone Encounter (Signed)
Patient called need a note to be out of work today and tomorrow since meds not ready til after 3:00 today. Once letter is ready please contact patient to pick up [phone].

## 2022-08-04 ENCOUNTER — Encounter: Payer: Self-pay | Admitting: Family Medicine

## 2022-08-04 NOTE — Telephone Encounter (Signed)
Spoke to pt, note for work set at the front for pick up.

## 2022-08-05 LAB — COVID-19, FLU A+B AND RSV
Influenza A, NAA: NOT DETECTED
Influenza B, NAA: NOT DETECTED
RSV, NAA: NOT DETECTED
SARS-CoV-2, NAA: DETECTED — AB

## 2022-08-06 ENCOUNTER — Other Ambulatory Visit: Payer: Self-pay | Admitting: Family Medicine

## 2022-08-06 NOTE — Progress Notes (Signed)
Correction, she is positive for Covid.

## 2022-08-06 NOTE — Progress Notes (Signed)
Please inform the patient that she tested negative for COVID

## 2022-08-06 NOTE — Progress Notes (Signed)
I spoke with the patient and informed her of her results.

## 2022-08-06 NOTE — Progress Notes (Signed)
She reports feeling better and does not want to be treated with paxlovid. She has been talking norel-ad and feeling better. She will be isolated until 08/07/22. The onset of symptoms was on 08/02/22.

## 2022-08-31 ENCOUNTER — Encounter: Payer: Self-pay | Admitting: Family Medicine

## 2022-08-31 ENCOUNTER — Ambulatory Visit: Admitting: Family Medicine

## 2022-08-31 VITALS — BP 122/78 | HR 80 | Temp 97.6°F | Ht 65.0 in | Wt 257.1 lb

## 2022-08-31 DIAGNOSIS — Z1211 Encounter for screening for malignant neoplasm of colon: Secondary | ICD-10-CM | POA: Diagnosis not present

## 2022-08-31 DIAGNOSIS — E7800 Pure hypercholesterolemia, unspecified: Secondary | ICD-10-CM

## 2022-08-31 DIAGNOSIS — E119 Type 2 diabetes mellitus without complications: Secondary | ICD-10-CM | POA: Diagnosis not present

## 2022-08-31 MED ORDER — TRULICITY 3 MG/0.5ML ~~LOC~~ SOAJ: 3.0000 mg | SUBCUTANEOUS | 5 refills | Status: DC

## 2022-08-31 NOTE — Patient Instructions (Signed)
Welcome to [facility]! It was a pleasure meeting you today.  As discussed, Please schedule a 3 month follow up visit today.  Increase trulicity to 3mg  weekly.    PLEASE NOTE:  If you had any LAB tests please let us know if you have not heard back within a few days. You may see your results on MyChart before we have a chance to review them but we will give you a call once they are reviewed by [place]. If we ordered any REFERRALS today, please let us know if you have not heard from their office within the next week.  Let us know through MyChart if you are needing REFILLS, or have your pharmacy send [place] the request. You can also use MyChart to communicate with me or any office staff.  Please try these tips to maintain a healthy lifestyle:  Eat most of your calories during the day when you are active. Eliminate processed foods including packaged sweets (pies, cakes, cookies), reduce intake of potatoes, white bread, white pasta, and white rice. Look for whole grain options, oat flour or almond flour.  Each meal should contain half fruits/vegetables, one quarter protein, and one quarter carbs (no bigger than a computer mouse).  Cut down on sweet beverages. This includes juice, soda, and sweet tea. Also watch fruit intake, though this is a healthier sweet option, it still contains natural sugar! Limit to 3 servings daily.  Drink at least 1 glass of water with each meal and aim for at least 8 glasses per day  Exercise at least 150 minutes every week.

## 2022-08-31 NOTE — Progress Notes (Signed)
New Patient Office Visit  Subjective:  Patient ID: Angela Bates, female    DOB: 01-22-1961  Age: 61 y.o. MRN: [id number]  CC:  Chief Complaint  Patient presents with   Establish Care    Need new pcp due pcp leaving practice Need to schedule colonoscopy     HPI Angela Bates presents for new pt, prev pcp leaving  Anemia-taking iron.  Cscope sev yrs ago.  Never heard from GI last yr DM type 2-"borderline" per pt.  Working on diet.  Walks at work and at home.  Baked foods. Decreased carbs.  On trulicity 1.5mg .  not losing wt. HLD-on zetia for 1 mo-tried a statin in past and itches.  Doing ok on zetia  Pt states  got flu shot when shingrix-but didn't get documented  Past Medical History:  Diagnosis Date   Diabetes mellitus without complication ([facility])    "borderline"   Hyperlipidemia     Past Surgical History:  Procedure Laterality Date   ABDOMINAL HYSTERECTOMY  11/08/2000   bso-bleeding    Family History  Problem Relation Age of Onset   Hypertension Mother    Cancer Father        unk   Diabetes Sister    Cancer Brother        Leukemia    Social History   Socioeconomic History   Marital status: Widowed    Spouse name: Not on file   Number of children: 3   Years of education: Not on file   Highest education level: Not on file  Occupational History   Not on file  Tobacco Use   Smoking status: Former    Packs/day: 0.50    Years: 5.00    Total pack years: 2.50    Types: Cigarettes    Quit date: 2000    Years since quitting: 23.8   Smokeless tobacco: Never   Tobacco comments:    quit >10 years prior  Vaping Use   Vaping Use: Never used  Substance and Sexual Activity   Alcohol use: No   Drug use: Never   Sexual activity: Not Currently  Other Topics Concern   Not on file  Social History Narrative   Mom lives w/her. 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   Social Determinants of Health   Financial Resource  Strain: Not on file  Food Insecurity: Not on file  Transportation Needs: Not on file  Physical Activity: Not on file  Stress: Not on file  Social Connections: Not on file  Intimate Partner Violence: Not on file    ROS  ROS: Gen: no fever, chills  Skin: no rash, itching ENT: no ear pain, ear drainage, nasal congestion, rhinorrhea, sinus pressure, sore throat Eyes: no blurry vision, double vision Resp: no cough, wheeze,SOB CV: no CP, palpitations, LE edema, some edema if stand long time GI: no heartburn, n/v/d/c, abd pain GU: no dysuria, urgency, frequency, hematuria MSK: no joint pain, myalgias, back pain Neuro: no dizziness, headache, weakness, vertigo Psych: no depression, anxiety, insomnia, SI   Objective:   Today's Vitals: BP 122/78   Pulse 80   Temp 97.6 F (36.4 C) (Temporal)   Ht 5\' 5"  (1.651 m)   Wt 257 lb 2 oz (116.6 kg)   SpO2 97%   BMI 42.79 kg/m   Physical Exam  Gen: WDWN NAD mo aaf HEENT: NCAT, conjunctiva not injected, sclera nonicteric NECK:  supple, no thyromegaly, no nodes, no carotid  bruits CARDIAC: RRR, S1S2+, no murmur. DP 2+B LUNGS: CTAB. No wheezes ABDOMEN:  BS+, soft, NTND, No HSM, no masses EXT:  no edema MSK: no gross abnormalities.  NEURO: A&O x3.  CN II-XII intact.  PSYCH: normal mood. Good eye contact   Assessment & Plan:   Problem List Items Addressed This Visit       Endocrine   Controlled type 2 diabetes mellitus without complication, without long-term current use of insulin ([place]) - Primary   Relevant Medications   Dulaglutide (TRULICITY) 3 ZY/6.0YT SOPN     Other   Hyperlipidemia   Other Visit Diagnoses     Screening for colon cancer       Relevant Orders   Ambulatory referral to Gastroenterology      DM type 12-2012 and 2016 A1C >6.4.  since then, less.  Chronic.  Well controlled on diet/exercise and trulicity.  Not losing wt-will inc Trulicity to 3mg .   F/u 3 mo HLD-chronic.  Rash to statin.  On zetia 10mg .  Cont.   F/u 3 mo Screen colon Ca-refer for cscope.  Outpatient Encounter Medications as of 08/31/2022  Medication Sig   Cholecalciferol (VITAMIN D3) 1000 units CAPS Take by mouth.   Dulaglutide (TRULICITY) 3 KZ/6.0FU SOPN Inject 3 mg as directed once a week.   ezetimibe (ZETIA) 10 MG tablet Take 1 tablet (10 mg total) by mouth daily.   ferrous sulfate 325 (65 FE) MG tablet Take 1 tablet (325 mg total) by mouth daily with breakfast.   ibuprofen (ADVIL) 800 MG tablet Take 800 mg by mouth.   Multiple Vitamin (MULTIVITAMIN) capsule Take 1 capsule by mouth daily.   [DISCONTINUED] Dulaglutide (TRULICITY) 1.5 XN/2.3FT SOPN INJECT 1.5 MG UNDER THE SKIN ONCE A WEEK. BEGIN AFTER COMPLETING TRULICITY 7.32 MG FOR 4 WEEKS   [DISCONTINUED] carbamide peroxide (DEBROX) 6.5 % OTIC solution Place 5 drops into the right ear 2 (two) times daily. (Patient not taking: Reported on 06/10/2022)   [DISCONTINUED] Chlorphen-PE-Acetaminophen (NOREL AD) 4-10-325 MG TABS Take 1 tablet every 4 hours, while symptoms persist. Do no take more than 6 tablets in 24 hrs.   No facility-administered encounter medications on file as of 08/31/2022.    Follow-up: Return in about 3 months (around 12/01/2022) for DM, HLD.   Wellington Hampshire, MD

## 2022-10-04 ENCOUNTER — Encounter: Payer: Self-pay | Admitting: Internal Medicine

## 2022-11-15 ENCOUNTER — Telehealth: Payer: Self-pay | Admitting: Family Medicine

## 2022-11-15 NOTE — Telephone Encounter (Signed)
Pt states she took last dose of Trulicity last week. She can not find it in stock anywhere. She is asking if there is an alternative or just wait until next visit on 12/01/22. Please advise

## 2022-12-01 ENCOUNTER — Ambulatory Visit: Admitting: Family Medicine

## 2022-12-01 ENCOUNTER — Encounter: Payer: Self-pay | Admitting: Family Medicine

## 2022-12-01 VITALS — BP 122/74 | HR 80 | Temp 97.7°F | Ht 65.0 in | Wt 259.5 lb

## 2022-12-01 DIAGNOSIS — E611 Iron deficiency: Secondary | ICD-10-CM

## 2022-12-01 DIAGNOSIS — E119 Type 2 diabetes mellitus without complications: Secondary | ICD-10-CM

## 2022-12-01 DIAGNOSIS — E7800 Pure hypercholesterolemia, unspecified: Secondary | ICD-10-CM | POA: Diagnosis not present

## 2022-12-01 DIAGNOSIS — E785 Hyperlipidemia, unspecified: Secondary | ICD-10-CM

## 2022-12-01 DIAGNOSIS — E559 Vitamin D deficiency, unspecified: Secondary | ICD-10-CM

## 2022-12-01 DIAGNOSIS — E1169 Type 2 diabetes mellitus with other specified complication: Secondary | ICD-10-CM

## 2022-12-01 DIAGNOSIS — Z888 Allergy status to other drugs, medicaments and biological substances status: Secondary | ICD-10-CM

## 2022-12-01 MED ORDER — EZETIMIBE 10 MG PO TABS: 10.0000 mg | ORAL_TABLET | Freq: Every day | ORAL | 3 refills | Status: DC

## 2022-12-01 NOTE — Patient Instructions (Signed)
It was very nice to see you today!  Call GI for colonoscopy   [phone].   PLEASE NOTE:  If you had any lab tests please let us know if you have not heard back within a few days. You may see your results on MyChart before we have a chance to review them but we will give you a call once they are reviewed by [place]. If we ordered any referrals today, please let us know if you have not heard from their office within the next week.   Please try these tips to maintain a healthy lifestyle:  Eat most of your calories during the day when you are active. Eliminate processed foods including packaged sweets (pies, cakes, cookies), reduce intake of potatoes, white bread, white pasta, and white rice. Look for whole grain options, oat flour or almond flour.  Each meal should contain half fruits/vegetables, one quarter protein, and one quarter carbs (no bigger than a computer mouse).  Cut down on sweet beverages. This includes juice, soda, and sweet tea. Also watch fruit intake, though this is a healthier sweet option, it still contains natural sugar! Limit to 3 servings daily.  Drink at least 1 glass of water with each meal and aim for at least 8 glasses per day  Exercise at least 150 minutes every week.

## 2022-12-01 NOTE — Progress Notes (Signed)
Subjective:     Patient ID: Angela Bates, female    DOB: 10-19-1961, 62 y.o.   MRN: [id number]  Chief Complaint  Patient presents with   Follow-up    3 month follow-up on HLD and DM Unable to get Trulicity, discuss possibly coming off of it    HPI  DM type 2-trulicity-ran out and pharm out so wants to try off. Working on diet. Decreased sugars. Eats 12-7. Hasn't been exercising.  HLD-on zetia.   Ate 3 hrs ago-chick, salad,and pork chop.intolerant statin-itch H/o low iron.  Taking iron daily. Hasn't heard from GI  Health Maintenance Due  Topic Date Due   COLONOSCOPY (Pts 45-34yrs Insurance coverage will need to be confirmed)  08/05/2021   Diabetic kidney evaluation - eGFR measurement  11/19/2022    Past Medical History:  Diagnosis Date   Diabetes mellitus without complication ([place])    "borderline"   Hyperlipidemia     Past Surgical History:  Procedure Laterality Date   ABDOMINAL HYSTERECTOMY  11/08/2000   bso-bleeding    Outpatient Medications Prior to Visit  Medication Sig Dispense Refill   Cholecalciferol (VITAMIN D3) 1000 units CAPS Take by mouth.     ferrous sulfate 325 (65 FE) MG tablet Take 1 tablet (325 mg total) by mouth daily with breakfast. 90 tablet 0   ibuprofen (ADVIL) 800 MG tablet Take 800 mg by mouth.     Multiple Vitamin (MULTIVITAMIN) capsule Take 1 capsule by mouth daily.     ezetimibe (ZETIA) 10 MG tablet Take 1 tablet (10 mg total) by mouth daily. 90 tablet 3   Dulaglutide (TRULICITY) 3 JH/4.1DE SOPN Inject 3 mg as directed once a week. (Patient not taking: Reported on 12/01/2022) 2 mL 5   No facility-administered medications prior to visit.    Allergies  Allergen Reactions   Ibuprofen Other (See Comments)   Statins Rash   ROS neg/noncontributory except as noted HPI/below Knees achey esp in cold.       Objective:     BP 122/74   Pulse 80   Temp 97.7 F (36.5 C) (Temporal)   Ht 5\' 5"  (1.651 m)   Wt 259 lb 8 oz (117.7 kg)   SpO2  97%   BMI 43.18 kg/m  Wt Readings from Last 3 Encounters:  12/01/22 259 lb 8 oz (117.7 kg)  08/31/22 257 lb 2 oz (116.6 kg)  08/03/22 260 lb (117.9 kg)    Physical Exam   Gen: WDWN NAD HEENT: NCAT, conjunctiva not injected, sclera nonicteric NECK:  supple, no thyromegaly, no nodes, no carotid bruits CARDIAC: RRR, S1S2+, no murmur.  LUNGS: CTAB. No wheezes ABDOMEN:  BS+, soft, NTND, No HSM, no masses EXT:  no edema MSK: no gross abnormalities.  NEURO: A&O x3.  CN II-XII intact.  PSYCH: normal mood. Good eye contact     Assessment & Plan:   Problem List Items Addressed This Visit       Endocrine   Controlled type 2 diabetes mellitus without complication, without long-term current use of insulin ([facility]) - Primary   Relevant Orders   Comprehensive metabolic panel   Hemoglobin A1c   Microalbumin / creatinine urine ratio     Other   Vitamin D deficiency   Relevant Orders   VITAMIN D 25 Hydroxy (Vit-D Deficiency, Fractures)   Hyperlipidemia   Relevant Medications   ezetimibe (ZETIA) 10 MG tablet   Other Relevant Orders   Comprehensive metabolic panel   Lipid panel   Other Visit  Diagnoses     Hyperlipidemia associated with type 2 diabetes mellitus ([place])       Relevant Medications   ezetimibe (ZETIA) 10 MG tablet   Iron deficiency       Relevant Orders   CBC with Differential/Platelet   IBC + Ferritin   Allergy to statin medication         1.  Diabetes type 2-chronic.  There was only 1 elevation of A1c that was greater than 6.5.  They have been less than 6.5 since.  Patient has been working on diet.  She had trouble getting Trulicity, and would like to be off of it anyway.  She knows what she needs to do diet wise.  We had a discussion on exercise.  She will get back to walking 30 minutes/day.  Check CBC, CMP, A1c, microalbumin creatinine ratio.  Follow-up in 3 months 2.  Hyperlipidemia-chronic.  Rash from statins so on Zetia 10 mg daily.  Continue.  Check CMP,  lipids 3.  Iron deficiency-patient is on iron daily.  Feeling good.  She never heard from GI.  Phone number given to her in the after visit summary.  She will call.  Will check CBC, iron studies.  Hopefully she can get off of iron. 4.  Vitamin D deficiency-taking vitamin D 1000 IUs/day.  Check vitamin D level 5.  Allergy to statin-patient is currently doing well on Zetia.  Meds ordered this encounter  Medications   ezetimibe (ZETIA) 10 MG tablet    Sig: Take 1 tablet (10 mg total) by mouth daily.    Dispense:  90 tablet    Refill:  3    Wellington Hampshire, MD

## 2022-12-02 LAB — COMPREHENSIVE METABOLIC PANEL
ALT: 19 U/L (ref 0–35)
AST: 19 U/L (ref 0–37)
Albumin: 3.7 g/dL (ref 3.5–5.2)
Alkaline Phosphatase: 100 U/L (ref 39–117)
BUN: 16 mg/dL (ref 6–23)
CO2: 30 mEq/L (ref 19–32)
Calcium: 8.9 mg/dL (ref 8.4–10.5)
Chloride: 102 mEq/L (ref 96–112)
Creatinine, Ser: 1.13 mg/dL (ref 0.40–1.20)
GFR: 52.49 mL/min — ABNORMAL LOW (ref >60.00)
Glucose, Bld: 93 mg/dL (ref 70–99)
Potassium: 4.3 mEq/L (ref 3.5–5.1)
Sodium: 140 mEq/L (ref 135–145)
Total Bilirubin: 0.4 mg/dL (ref 0.2–1.2)
Total Protein: 6.6 g/dL (ref 6.0–8.3)

## 2022-12-02 LAB — CBC WITH DIFFERENTIAL/PLATELET
Basophils Absolute: 0.1 10*3/uL (ref 0.0–0.1)
Basophils Relative: 1.2 % (ref 0.0–3.0)
Eosinophils Absolute: 0.1 10*3/uL (ref 0.0–0.7)
Eosinophils Relative: 1.2 % (ref 0.0–5.0)
HCT: 37.4 % (ref 36.0–46.0)
Hemoglobin: 11.9 g/dL — ABNORMAL LOW (ref 12.0–15.0)
Lymphocytes Relative: 29.4 % (ref 12.0–46.0)
Lymphs Abs: 3.1 10*3/uL (ref 0.7–4.0)
MCHC: 31.9 g/dL (ref 30.0–36.0)
MCV: 72.9 fl — ABNORMAL LOW (ref 78.0–100.0)
Monocytes Absolute: 0.6 10*3/uL (ref 0.1–1.0)
Monocytes Relative: 6 % (ref 3.0–12.0)
Neutro Abs: 6.6 10*3/uL (ref 1.4–7.7)
Neutrophils Relative %: 62.2 % (ref 43.0–77.0)
Platelets: 160 10*3/uL (ref 150.0–400.0)
RBC: 5.13 Mil/uL — ABNORMAL HIGH (ref 3.87–5.11)
RDW: 16 % — ABNORMAL HIGH (ref 11.5–15.5)
WBC: 10.6 10*3/uL — ABNORMAL HIGH (ref 4.0–10.5)

## 2022-12-02 LAB — LIPID PANEL
Cholesterol: 177 mg/dL (ref 0–200)
HDL: 49.7 mg/dL (ref >39.00)
LDL Cholesterol: 96 mg/dL (ref 0–99)
NonHDL: 127.05
Total CHOL/HDL Ratio: 4
Triglycerides: 155 mg/dL — ABNORMAL HIGH (ref 0.0–149.0)
VLDL: 31 mg/dL (ref 0.0–40.0)

## 2022-12-02 LAB — IBC + FERRITIN
Ferritin: 80.2 ng/mL (ref 10.0–291.0)
Iron: 73 ug/dL (ref 42–145)
Saturation Ratios: 23.8 % (ref 20.0–50.0)
TIBC: 306.6 ug/dL (ref 250.0–450.0)
Transferrin: 219 mg/dL (ref 212.0–360.0)

## 2022-12-02 LAB — MICROALBUMIN / CREATININE URINE RATIO
Creatinine,U: 109.6 mg/dL
Microalb Creat Ratio: 0.6 mg/g (ref 0.0–30.0)
Microalb, Ur: <0.7 mg/dL (ref 0.0–1.9)

## 2022-12-02 LAB — HEMOGLOBIN A1C: Hgb A1c MFr Bld: 6.3 % (ref 4.6–6.5)

## 2022-12-02 LAB — VITAMIN D 25 HYDROXY (VIT D DEFICIENCY, FRACTURES): VITD: 36.84 ng/mL (ref 30.00–100.00)

## 2022-12-02 NOTE — Progress Notes (Signed)
1.  Cholesterol at goal-continue zetia 2.  A1C increased to 6.3-continue to work on diet and add exercise.  3.  Hemoglobin has dropped again-make sure you call GI.  Stay on iron 2x/d.  Reck cbc in 2-3 weeks as not sure how fast dropping, also add sickle screen.  Has anyone done work up for the anemia or just say take iron

## 2022-12-03 ENCOUNTER — Other Ambulatory Visit: Payer: Self-pay | Admitting: *Deleted

## 2022-12-03 DIAGNOSIS — D649 Anemia, unspecified: Secondary | ICD-10-CM

## 2022-12-14 ENCOUNTER — Ambulatory Visit: Admitting: Family Medicine

## 2022-12-14 ENCOUNTER — Ambulatory Visit: Admitting: Nurse Practitioner

## 2022-12-20 ENCOUNTER — Other Ambulatory Visit (INDEPENDENT_AMBULATORY_CARE_PROVIDER_SITE_OTHER)

## 2022-12-20 DIAGNOSIS — D649 Anemia, unspecified: Secondary | ICD-10-CM

## 2022-12-20 LAB — CBC
HCT: 39.2 % (ref 36.0–46.0)
Hemoglobin: 12.4 g/dL (ref 12.0–15.0)
MCHC: 31.7 g/dL (ref 30.0–36.0)
MCV: 72.7 fl — ABNORMAL LOW (ref 78.0–100.0)
Platelets: 144 10*3/uL — ABNORMAL LOW (ref 150.0–400.0)
RBC: 5.39 Mil/uL — ABNORMAL HIGH (ref 3.87–5.11)
RDW: 16.1 % — ABNORMAL HIGH (ref 11.5–15.5)
WBC: 9.7 10*3/uL (ref 4.0–10.5)

## 2022-12-21 LAB — SICKLE CELL SCREEN: Sickle Solubility Test - HGBRFX: NEGATIVE

## 2022-12-23 LAB — HGB FRACTIONATION CASCADE
Hgb A2: 2.2 % (ref 1.8–3.2)
Hgb A: 97.8 % (ref 96.4–98.8)
Hgb F: 0 % (ref 0.0–2.0)
Hgb S: 0 %

## 2022-12-26 NOTE — Progress Notes (Signed)
Hemoglobin is slightly better.  Continue on the iron.  Make sure you are following up with GI.  Sickle is negative.  Will continue to monitor.  Will repeat at appointment in April.

## 2023-01-05 IMAGING — MG MM DIGITAL SCREENING BILAT W/ TOMO AND CAD
8 series · 8 of 24 positions shown · non-contrast
Comparison: Previous exam(s).

CLINICAL DATA: Screening.

EXAM:
DIGITAL SCREENING BILATERAL MAMMOGRAM WITH TOMOSYNTHESIS AND CAD
TECHNIQUE: Bilateral screening digital craniocaudal and mediolateral oblique
mammograms were obtained. Bilateral screening digital breast
tomosynthesis was performed. The images were evaluated with
computer-aided detection.

[L CC synth-2D]
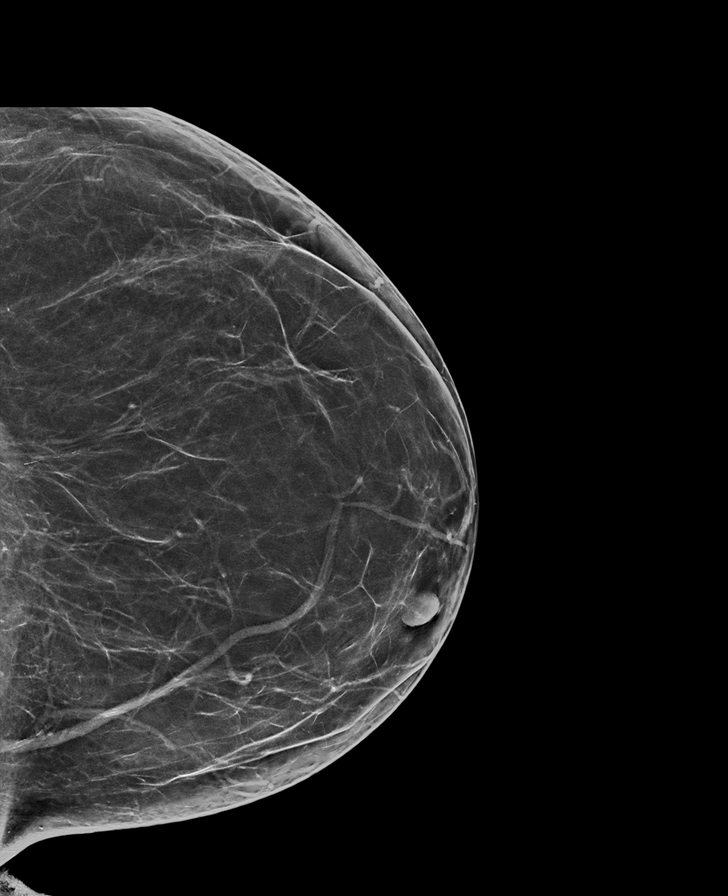

[R CC synth-2D]
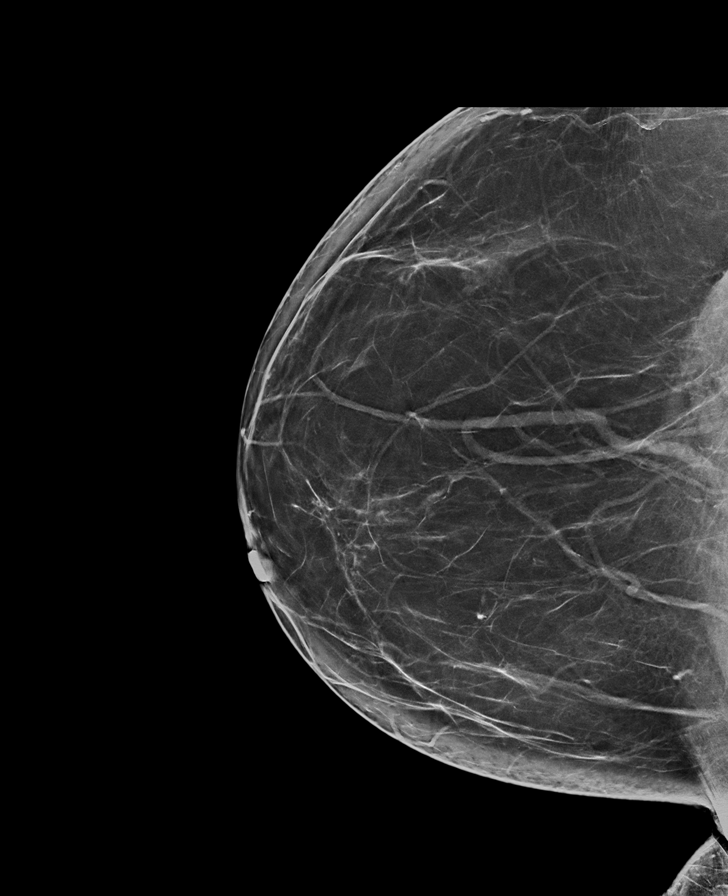

[L MLO synth-2D]
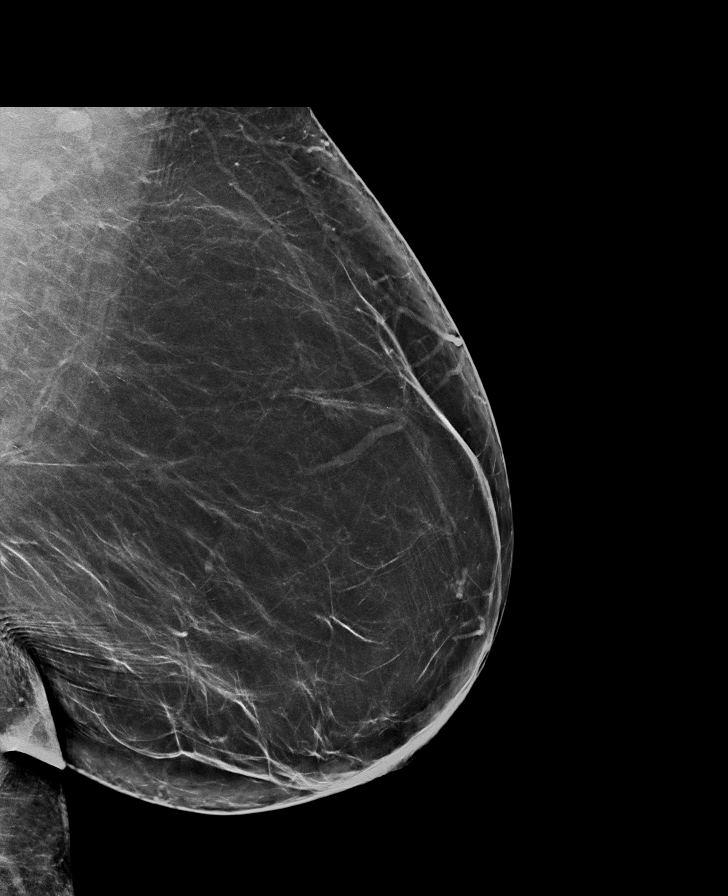

[R MLO synth-2D]
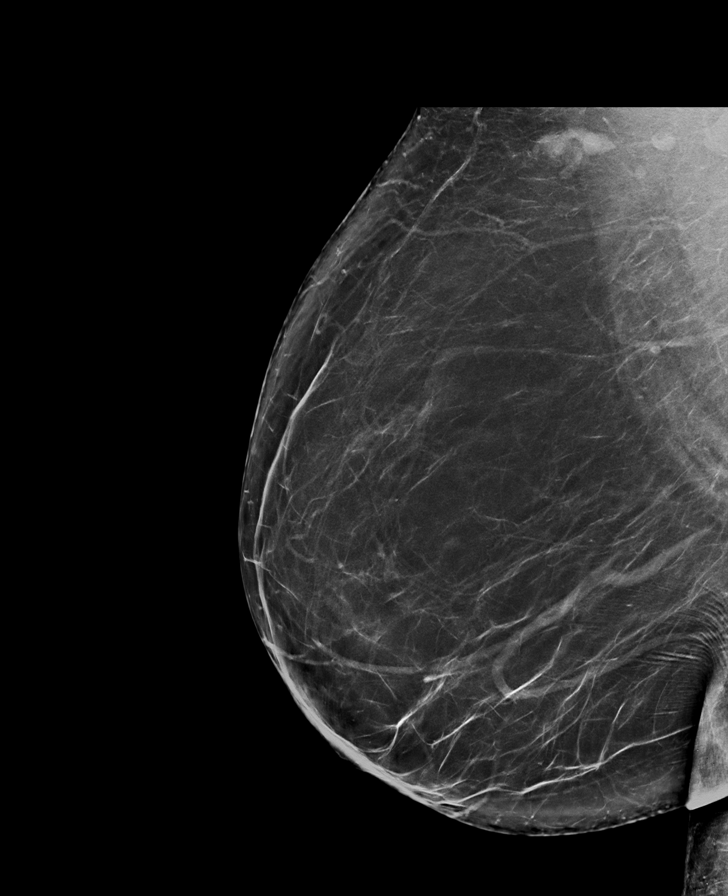

[L CC tomo · tomo slice 37/74.0]
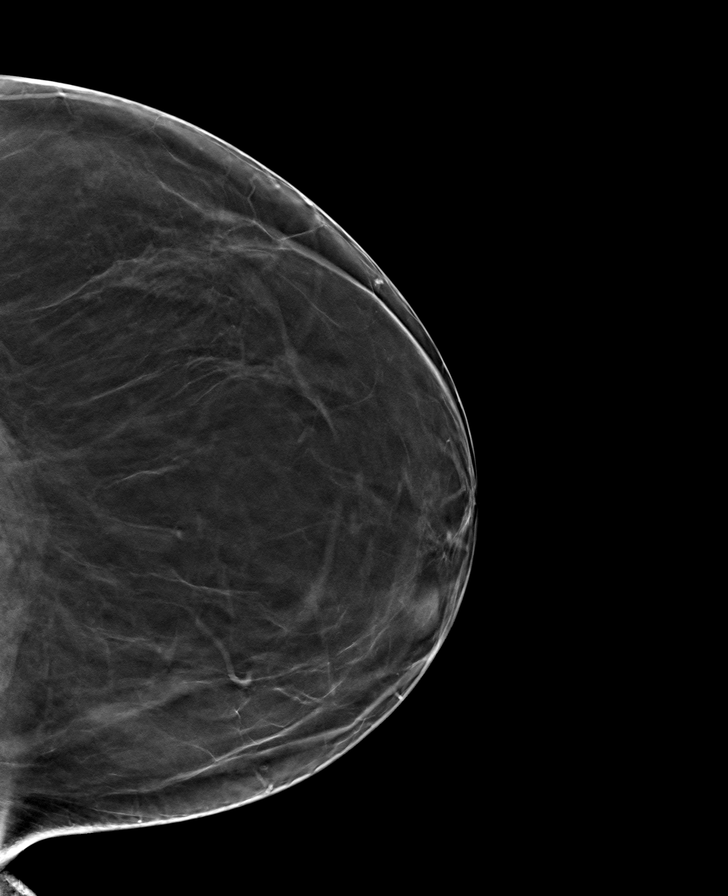

[R CC tomo · tomo slice 44/87.0]
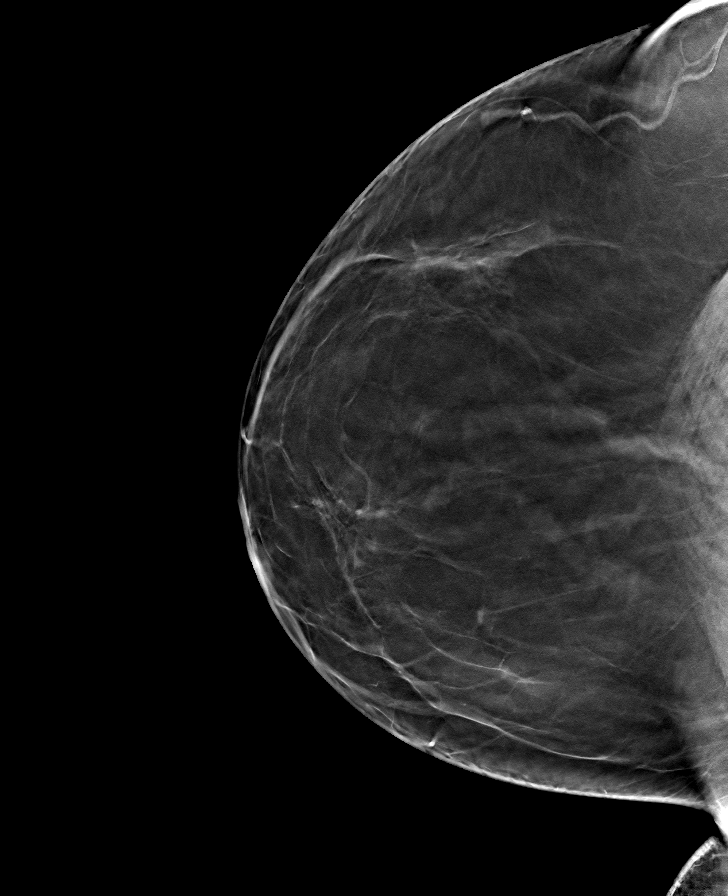

[L MLO tomo · tomo slice 44/87.0]
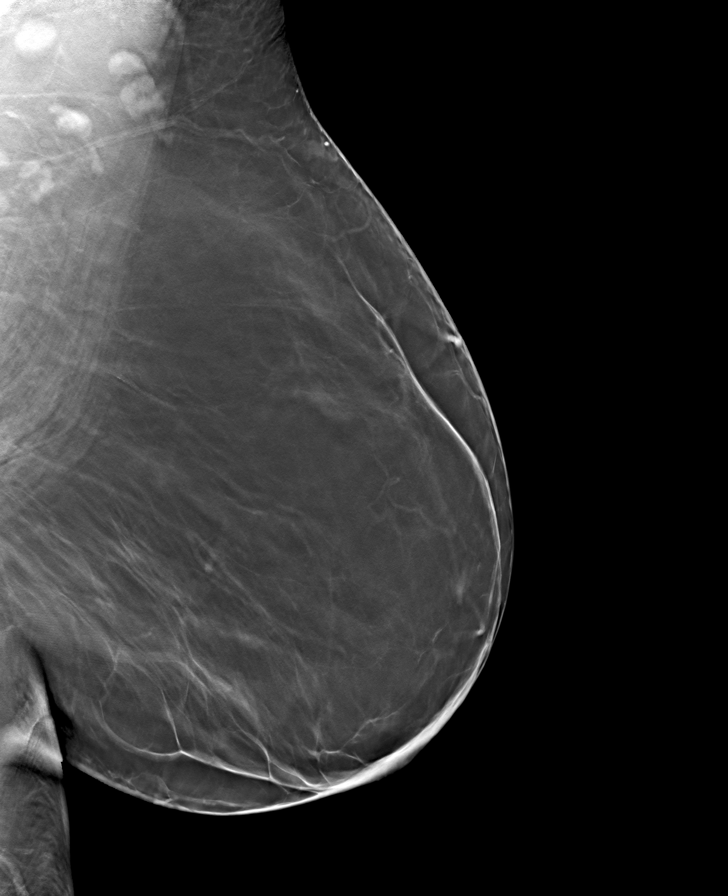

[R MLO tomo · tomo slice 47/93.0]
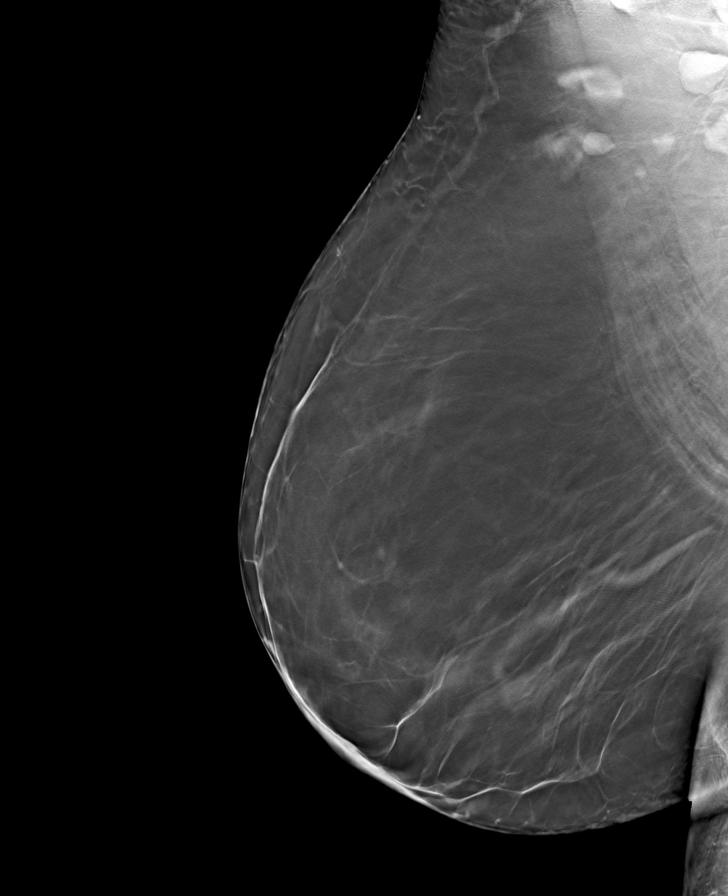

[8 of 24 positions shown; findings below may reference images not displayed]

ACR Breast Density Category b: There are scattered areas of
fibroglandular density.
FINDINGS: There are no findings suspicious for malignancy.
IMPRESSION: No mammographic evidence of malignancy. A result letter of this
screening mammogram will be mailed directly to the patient.

RECOMMENDATION:
Screening mammogram in one year. (Code:51-O-LD2)

BI-RADS CATEGORY  1: Negative.

## 2023-02-03 ENCOUNTER — Other Ambulatory Visit (HOSPITAL_COMMUNITY): Payer: Self-pay | Admitting: Family Medicine

## 2023-02-03 DIAGNOSIS — Z1231 Encounter for screening mammogram for malignant neoplasm of breast: Secondary | ICD-10-CM

## 2023-02-25 LAB — HM DIABETES EYE EXAM

## 2023-03-02 ENCOUNTER — Ambulatory Visit: Admitting: Family Medicine

## 2023-03-07 ENCOUNTER — Ambulatory Visit: Admitting: Family Medicine

## 2023-03-14 ENCOUNTER — Ambulatory Visit (HOSPITAL_COMMUNITY)
Admission: RE | Admit: 2023-03-14 | Discharge: 2023-03-14 | Disposition: A | Discharge status: Home / Self Care | Source: Ambulatory Visit | Attending: Family Medicine | Admitting: Family Medicine

## 2023-03-14 DIAGNOSIS — Z1231 Encounter for screening mammogram for malignant neoplasm of breast: Secondary | ICD-10-CM

## 2023-03-15 ENCOUNTER — Other Ambulatory Visit (HOSPITAL_COMMUNITY): Payer: Self-pay | Admitting: Family Medicine

## 2023-03-15 DIAGNOSIS — R928 Other abnormal and inconclusive findings on diagnostic imaging of breast: Secondary | ICD-10-CM

## 2023-03-25 ENCOUNTER — Encounter: Payer: Self-pay | Admitting: Family Medicine

## 2023-03-25 ENCOUNTER — Ambulatory Visit: Admitting: Family Medicine

## 2023-03-25 VITALS — BP 120/72 | HR 81 | Temp 97.8°F | Resp 16 | Ht 67.0 in | Wt 264.2 lb

## 2023-03-25 DIAGNOSIS — E7800 Pure hypercholesterolemia, unspecified: Secondary | ICD-10-CM

## 2023-03-25 DIAGNOSIS — E559 Vitamin D deficiency, unspecified: Secondary | ICD-10-CM

## 2023-03-25 DIAGNOSIS — E1122 Type 2 diabetes mellitus with diabetic chronic kidney disease: Secondary | ICD-10-CM

## 2023-03-25 DIAGNOSIS — N182 Chronic kidney disease, stage 2 (mild): Secondary | ICD-10-CM

## 2023-03-25 DIAGNOSIS — Z7985 Long-term (current) use of injectable non-insulin antidiabetic drugs: Secondary | ICD-10-CM | POA: Diagnosis not present

## 2023-03-25 MED ORDER — TRULICITY 0.75 MG/0.5ML ~~LOC~~ SOAJ: 0.7500 mg | SUBCUTANEOUS | 0 refills | Status: DC

## 2023-03-25 NOTE — Assessment & Plan Note (Signed)
Chronic.  Controlled.  Continue D

## 2023-03-25 NOTE — Patient Instructions (Signed)
It was very nice to see you today!  trulicity   PLEASE NOTE:  If you had any lab tests please let us know if you have not heard back within a few days. You may see your results on MyChart before we have a chance to review them but we will give you a call once they are reviewed by [place]. If we ordered any referrals today, please let us know if you have not heard from their office within the next week.   Please try these tips to maintain a healthy lifestyle:  Eat most of your calories during the day when you are active. Eliminate processed foods including packaged sweets (pies, cakes, cookies), reduce intake of potatoes, white bread, white pasta, and white rice. Look for whole grain options, oat flour or almond flour.  Each meal should contain half fruits/vegetables, one quarter protein, and one quarter carbs (no bigger than a computer mouse).  Cut down on sweet beverages. This includes juice, soda, and sweet tea. Also watch fruit intake, though this is a healthier sweet option, it still contains natural sugar! Limit to 3 servings daily.  Drink at least 1 glass of water with each meal and aim for at least 8 glasses per day  Exercise at least 150 minutes every week.

## 2023-03-25 NOTE — Assessment & Plan Note (Signed)
Chronic.  Controled.  Was on Trulicity.  Shortage issues so off for 2 month(s), but not doing well on diet/exercise now.  Will restart Trulicity-need to restart titration.  Follow up 3 month(s).  Work on diet/exercise

## 2023-03-25 NOTE — Assessment & Plan Note (Signed)
Chronic.  Well controlled.  Continue zetia 10 mg daily.  Allergy to statin

## 2023-03-25 NOTE — Progress Notes (Signed)
   Subjective:     Patient ID: Angela Bates, female    DOB: 06-05-1961, 62 y.o.   MRN: [id number]  Chief Complaint  Patient presents with   Hyperlipidemia   Diabetes    HPI  DM-type 2- diet controlled.  Was on Trulicity in past but supply problem. Stressed and not doing well.  Not exercising HLD-on zetia 10 mg  statin caused itch/rash.  Working on diet/exercise Spot in breast-repeat scheduled for 5/21. Iron deficiency anemia-still not heard from gastroenterology.   Health Maintenance Due  Topic Date Due   COLONOSCOPY (Pts 45-31yrs Insurance coverage will need to be confirmed)  08/05/2021    Past Medical History:  Diagnosis Date   Diabetes mellitus without complication (HCC)    "borderline"   Hyperlipidemia     Past Surgical History:  Procedure Laterality Date   ABDOMINAL HYSTERECTOMY  11/08/2000   bso-bleeding     Current Outpatient Medications:    Cholecalciferol (VITAMIN D3) 1000 units CAPS, Take by mouth., Disp: , Rfl:    Dulaglutide (TRULICITY) 0.75 MG/0.5ML SOPN, Inject 0.75 mg into the skin once a week., Disp: 2 mL, Rfl: 0   ezetimibe (ZETIA) 10 MG tablet, Take 1 tablet (10 mg total) by mouth daily., Disp: 90 tablet, Rfl: 3   ferrous sulfate 325 (65 FE) MG tablet, Take 1 tablet (325 mg total) by mouth daily with breakfast., Disp: 90 tablet, Rfl: 0   ibuprofen (ADVIL) 800 MG tablet, Take 800 mg by mouth., Disp: , Rfl:    Multiple Vitamin (MULTIVITAMIN) capsule, Take 1 capsule by mouth daily., Disp: , Rfl:   Allergies  Allergen Reactions   Ibuprofen Other (See Comments)   Statins Rash   ROS neg/noncontributory except as noted HPI/below      Objective:     BP 120/72 (BP Location: Right Arm, Patient Position: Sitting, Cuff Size: Large)   Pulse 81   Temp 97.8 F (36.6 C) (Temporal)   Resp 16   Ht 5\' 7"  (1.702 m)   Wt 264 lb 3.2 oz (119.8 kg)   SpO2 98%   BMI 41.38 kg/m  Wt Readings from Last 3 Encounters:  03/25/23 264 lb 3.2 oz (119.8 kg)   12/01/22 259 lb 8 oz (117.7 kg)  08/31/22 257 lb 2 oz (116.6 kg)    Physical Exam   Gen: WDWN NAD HEENT: NCAT, conjunctiva not injected, sclera nonicteric NECK:  supple, no thyromegaly, no nodes, no carotid bruits CARDIAC: RRR, S1S2+, no murmur. DP 2+B LUNGS: CTAB. No wheezes ABDOMEN:  BS+, soft, NTND, No HSM, no masses EXT:  no edema MSK: no gross abnormalities.  NEURO: A&O x3.  CN II-XII intact.  PSYCH: normal mood. Good eye contact     Assessment & Plan:  CKD stage 2 due to type 2 diabetes mellitus (HCC) Assessment & Plan: Chronic.  Controled.  Was on Trulicity.  Shortage issues so off for 2 month(s), but not doing well on diet/exercise now.  Will restart Trulicity-need to restart titration.  Follow up 3 month(s).  Work on diet/exercise   Pure hypercholesterolemia Assessment & Plan: Chronic.  Well controlled.  Continue zetia 10 mg daily.  Allergy to statin   Vitamin D deficiency Assessment & Plan: Chronic.  Controlled.  Continue D   Other orders -     Trulicity; Inject 0.75 mg into the skin once a week.  Dispense: 2 mL; Refill: 0  Follow up 3 mo  Angelena Sole, MD

## 2023-03-29 ENCOUNTER — Ambulatory Visit (HOSPITAL_COMMUNITY)
Admission: RE | Admit: 2023-03-29 | Discharge: 2023-03-29 | Disposition: A | Discharge status: Home / Self Care | Source: Ambulatory Visit | Attending: Family Medicine | Admitting: Family Medicine

## 2023-03-29 ENCOUNTER — Encounter (HOSPITAL_COMMUNITY): Payer: Self-pay

## 2023-03-29 DIAGNOSIS — R928 Other abnormal and inconclusive findings on diagnostic imaging of breast: Secondary | ICD-10-CM

## 2023-04-06 ENCOUNTER — Other Ambulatory Visit: Payer: Self-pay | Admitting: Family Medicine

## 2023-04-07 ENCOUNTER — Other Ambulatory Visit: Payer: Self-pay | Admitting: Family Medicine

## 2023-04-07 MED ORDER — TRULICITY 1.5 MG/0.5ML ~~LOC~~ SOAJ: 1.5000 mg | SUBCUTANEOUS | 0 refills | Status: DC

## 2023-05-19 ENCOUNTER — Other Ambulatory Visit: Payer: Self-pay | Admitting: *Deleted

## 2023-05-19 ENCOUNTER — Telehealth: Payer: Self-pay | Admitting: Family Medicine

## 2023-05-19 MED ORDER — TRULICITY 1.5 MG/0.5ML ~~LOC~~ SOAJ: 1.5000 mg | SUBCUTANEOUS | 0 refills | Status: DC

## 2023-05-19 NOTE — Telephone Encounter (Signed)
Prescription Request  05/19/2023  LOV: 03/25/2023  What is the name of the medication or equipment? Dulaglutide (TRULICITY) 1.5 MG/0.5ML SOPN   Have you contacted your pharmacy to request a refill? Yes   Which pharmacy would you like this sent to?   [facility] - [place], [place] - [address] [place] [address] Phone: [phone] Fax: [phone]   Patient notified that their request is being sent to the clinical staff for review and that they should receive a response within 2 business days.   Please advise at Mobile [phone] (mobile)

## 2023-05-19 NOTE — Telephone Encounter (Signed)
Rx sent to the pharmacy.

## 2023-06-08 ENCOUNTER — Encounter (INDEPENDENT_AMBULATORY_CARE_PROVIDER_SITE_OTHER): Payer: Self-pay

## 2023-06-14 ENCOUNTER — Telehealth: Payer: Self-pay | Admitting: Family Medicine

## 2023-06-14 ENCOUNTER — Other Ambulatory Visit: Payer: Self-pay | Admitting: *Deleted

## 2023-06-14 MED ORDER — TRULICITY 1.5 MG/0.5ML ~~LOC~~ SOAJ: 1.5000 mg | SUBCUTANEOUS | 0 refills | Status: DC

## 2023-06-14 NOTE — Telephone Encounter (Signed)
Prescription Request  06/14/2023  LOV: 03/25/2023  What is the name of the medication or equipment? Dulaglutide (TRULICITY) 1.5 MG/0.5ML SOPN   Have you contacted your pharmacy to request a refill? Yes   Which pharmacy would you like this sent to?    [facility] - [place], [place] - [address] [place] [address] Phone: [phone] Fax: [phone]    Patient notified that their request is being sent to the clinical staff for review and that they should receive a response within 2 business days.   Please advise at [facility] [phone]

## 2023-06-14 NOTE — Telephone Encounter (Signed)
Rx sent to the pharmacy.

## 2023-06-24 ENCOUNTER — Ambulatory Visit: Admitting: Family Medicine

## 2023-07-19 ENCOUNTER — Ambulatory Visit: Admitting: Family Medicine

## 2023-07-19 ENCOUNTER — Encounter: Payer: Self-pay | Admitting: Family Medicine

## 2023-07-19 VITALS — BP 130/76 | HR 81 | Temp 97.6°F | Resp 18 | Ht 67.0 in | Wt 255.4 lb

## 2023-07-19 DIAGNOSIS — E785 Hyperlipidemia, unspecified: Secondary | ICD-10-CM

## 2023-07-19 DIAGNOSIS — R059 Cough, unspecified: Secondary | ICD-10-CM

## 2023-07-19 DIAGNOSIS — E119 Type 2 diabetes mellitus without complications: Secondary | ICD-10-CM

## 2023-07-19 DIAGNOSIS — E1169 Type 2 diabetes mellitus with other specified complication: Secondary | ICD-10-CM | POA: Diagnosis not present

## 2023-07-19 DIAGNOSIS — E611 Iron deficiency: Secondary | ICD-10-CM | POA: Diagnosis not present

## 2023-07-19 DIAGNOSIS — Z888 Allergy status to other drugs, medicaments and biological substances status: Secondary | ICD-10-CM

## 2023-07-19 DIAGNOSIS — Z7985 Long-term (current) use of injectable non-insulin antidiabetic drugs: Secondary | ICD-10-CM

## 2023-07-19 LAB — COMPREHENSIVE METABOLIC PANEL
ALT: 15 U/L (ref 0–35)
AST: 17 U/L (ref 0–37)
Albumin: 3.7 g/dL (ref 3.5–5.2)
Alkaline Phosphatase: 94 U/L (ref 39–117)
BUN: 11 mg/dL (ref 6–23)
CO2: 30 meq/L (ref 19–32)
Calcium: 9.3 mg/dL (ref 8.4–10.5)
Chloride: 103 meq/L (ref 96–112)
Creatinine, Ser: 1.03 mg/dL (ref 0.40–1.20)
GFR: 58.4 mL/min — ABNORMAL LOW (ref >60.00)
Glucose, Bld: 87 mg/dL (ref 70–99)
Potassium: 4.1 meq/L (ref 3.5–5.1)
Sodium: 139 meq/L (ref 135–145)
Total Bilirubin: 0.6 mg/dL (ref 0.2–1.2)
Total Protein: 7.2 g/dL (ref 6.0–8.3)

## 2023-07-19 LAB — LIPID PANEL
Cholesterol: 149 mg/dL (ref 0–200)
HDL: 48.3 mg/dL (ref >39.00)
LDL Cholesterol: 76 mg/dL (ref 0–99)
NonHDL: 100.25
Total CHOL/HDL Ratio: 3
Triglycerides: 122 mg/dL (ref 0.0–149.0)
VLDL: 24.4 mg/dL (ref 0.0–40.0)

## 2023-07-19 LAB — CBC WITH DIFFERENTIAL/PLATELET
Basophils Absolute: 0 10*3/uL (ref 0.0–0.1)
Basophils Relative: 0.5 % (ref 0.0–3.0)
Eosinophils Absolute: 0.2 10*3/uL (ref 0.0–0.7)
Eosinophils Relative: 2 % (ref 0.0–5.0)
HCT: 40.2 % (ref 36.0–46.0)
Hemoglobin: 12.5 g/dL (ref 12.0–15.0)
Lymphocytes Relative: 27.6 % (ref 12.0–46.0)
Lymphs Abs: 2.5 10*3/uL (ref 0.7–4.0)
MCHC: 31 g/dL (ref 30.0–36.0)
MCV: 72.7 fl — ABNORMAL LOW (ref 78.0–100.0)
Monocytes Absolute: 0.7 10*3/uL (ref 0.1–1.0)
Monocytes Relative: 8.1 % (ref 3.0–12.0)
Neutro Abs: 5.5 10*3/uL (ref 1.4–7.7)
Neutrophils Relative %: 61.8 % (ref 43.0–77.0)
Platelets: 161 10*3/uL (ref 150.0–400.0)
RBC: 5.53 Mil/uL — ABNORMAL HIGH (ref 3.87–5.11)
RDW: 16.2 % — ABNORMAL HIGH (ref 11.5–15.5)
WBC: 8.9 10*3/uL (ref 4.0–10.5)

## 2023-07-19 LAB — MICROALBUMIN / CREATININE URINE RATIO
Creatinine,U: 174.8 mg/dL
Microalb Creat Ratio: 0.4 mg/g (ref 0.0–30.0)
Microalb, Ur: <0.7 mg/dL (ref 0.0–1.9)

## 2023-07-19 LAB — IBC + FERRITIN
Ferritin: 130.2 ng/mL (ref 10.0–291.0)
Iron: 84 ug/dL (ref 42–145)
Saturation Ratios: 28 % (ref 20.0–50.0)
TIBC: 299.6 ug/dL (ref 250.0–450.0)
Transferrin: 214 mg/dL (ref 212.0–360.0)

## 2023-07-19 LAB — POC COVID19 BINAXNOW: SARS Coronavirus 2 Ag: NEGATIVE

## 2023-07-19 LAB — TSH: TSH: 1.64 u[IU]/mL (ref 0.35–5.50)

## 2023-07-19 LAB — HEMOGLOBIN A1C: Hgb A1c MFr Bld: 6.2 % (ref 4.6–6.5)

## 2023-07-19 MED ORDER — EZETIMIBE 10 MG PO TABS: 10.0000 mg | ORAL_TABLET | Freq: Every day | ORAL | 3 refills | Status: DC

## 2023-07-19 MED ORDER — TRULICITY 1.5 MG/0.5ML ~~LOC~~ SOAJ: 1.5000 mg | SUBCUTANEOUS | 1 refills | Status: DC

## 2023-07-19 MED ORDER — TRULICITY 1.5 MG/0.5ML ~~LOC~~ SOAJ: 1.5000 mg | SUBCUTANEOUS | 0 refills | Status: DC

## 2023-07-19 NOTE — Progress Notes (Signed)
Subjective:     Patient ID: Angela Bates, female    DOB: [birth year], 62 y.o.   MRN: [id number]  Chief Complaint  Patient presents with   Medical Management of Chronic Issues    3 month follow-up on dm Need medication refills     HPI DM, type 2 - Managed by Trulicity 1.5 mg since 5/17. Reports her diet is controlled by reducing sugars, and she is exercising regularly. States she does not monitor glucose at home.  HLD - Managed by Zetia 10 mg. Tolerating well, denies side effects. Rash to statins  Sore Throat - Noticed feeling dry and sore throat while in class; requested Covid testing to rule out. Covid test was negative.  Iron def-still hasn't sch colon  Health Maintenance Due  Topic Date Due   Colonoscopy  08/05/2021   HEMOGLOBIN A1C  06/01/2023    Past Medical History:  Diagnosis Date   Diabetes mellitus without complication (HCC)    "borderline"   Hyperlipidemia     Past Surgical History:  Procedure Laterality Date   ABDOMINAL HYSTERECTOMY  11/08/2000   bso-bleeding     Current Outpatient Medications:    Cholecalciferol (VITAMIN D3) 1000 units CAPS, Take by mouth., Disp: , Rfl:    ferrous sulfate 325 (65 FE) MG tablet, Take 1 tablet (325 mg total) by mouth daily with breakfast., Disp: 90 tablet, Rfl: 0   ibuprofen (ADVIL) 800 MG tablet, Take 800 mg by mouth., Disp: , Rfl:    Multiple Vitamin (MULTIVITAMIN) capsule, Take 1 capsule by mouth daily., Disp: , Rfl:    Dulaglutide (TRULICITY) 1.5 MG/0.5ML SOPN, Inject 1.5 mg into the skin once a week., Disp: 6 mL, Rfl: 1   ezetimibe (ZETIA) 10 MG tablet, Take 1 tablet (10 mg total) by mouth daily., Disp: 90 tablet, Rfl: 3  Allergies  Allergen Reactions   Ibuprofen Other (See Comments)   Statins Rash   ROS neg/noncontributory except as noted HPI/below      Objective:     BP 130/76   Pulse 81   Temp 97.6 F (36.4 C) (Temporal)   Resp 18   Ht 5\' 7"  (1.702 m)   Wt 255 lb 6 oz (115.8 kg)   SpO2 98%   BMI  40.00 kg/m  Wt Readings from Last 3 Encounters:  07/19/23 255 lb 6 oz (115.8 kg)  03/25/23 264 lb 3.2 oz (119.8 kg)  12/01/22 259 lb 8 oz (117.7 kg)    Physical Exam   Gen: WDWN NAD HEENT: NCAT, conjunctiva not injected, sclera nonicteric NECK:  supple, no thyromegaly, no nodes, no carotid bruits CARDIAC: RRR, S1S2+, no murmur. DP 2+B LUNGS: CTAB. No wheezes ABDOMEN:  BS+, soft, NTND, No HSM, no masses EXT:  no edema MSK: no gross abnormalities.  NEURO: A&O x3.  CN II-XII intact.  PSYCH: normal mood. Good eye contact  Results for orders placed or performed in visit on 07/19/23  POC COVID-19  Result Value Ref Range   SARS Coronavirus 2 Ag Negative Negative        Assessment & Plan:  Controlled type 2 diabetes mellitus without complication, without long-term current use of insulin (HCC) Assessment & Plan: Chronic.  Controlled.  Continue trulicity 1.5mg  weekly.  Work on diet/exercise  Orders: -     Comprehensive metabolic panel -     Hemoglobin A1c -     Lipid panel -     Microalbumin / creatinine urine ratio -     CBC with Differential/Platelet -  TSH -     Trulicity; Inject 1.5 mg into the skin once a week.  Dispense: 6 mL; Refill: 1  Hyperlipidemia associated with type 2 diabetes mellitus (HCC) Assessment & Plan: Chronic.  Allergic to statins.  On zetia 10mg .  Working on diet/exercise  Orders: -     Comprehensive metabolic panel -     Lipid panel -     Ezetimibe; Take 1 tablet (10 mg total) by mouth daily.  Dispense: 90 tablet; Refill: 3  Iron deficiency Assessment & Plan: Chronic.  Check labs.  Get colonoscopy sch!  Orders: -     CBC with Differential/Platelet -     IBC + Ferritin  Allergy to statin medication  Long-term current use of injectable noninsulin antidiabetic medication  Cough, unspecified type -     POC COVID-19 BinaxNow    Return in about 6 months (around 01/16/2024) for DM, cholesterol.   I,Emily Lagle,acting as a Neurosurgeon for Angelena Sole, MD.,have documented all relevant documentation on the behalf of Angelena Sole, MD,as directed by  Angelena Sole, MD while in the presence of Angelena Sole, MD.   I, Angelena Sole, MD, have reviewed all documentation for this visit. The documentation on 07/19/23 for the exam, diagnosis, procedures, and orders are all accurate and complete.  Angelena Sole, MD

## 2023-07-19 NOTE — Assessment & Plan Note (Signed)
Chronic.  Allergic to statins.  On zetia 10mg .  Working on diet/exercise

## 2023-07-19 NOTE — Patient Instructions (Signed)
It was very nice to see you today!  Call GI [phone]   PLEASE NOTE:  If you had any lab tests please let us know if you have not heard back within a few days. You may see your results on MyChart before we have a chance to review them but we will give you a call once they are reviewed by [place]. If we ordered any referrals today, please let us know if you have not heard from their office within the next week.   Please try these tips to maintain a healthy lifestyle:  Eat most of your calories during the day when you are active. Eliminate processed foods including packaged sweets (pies, cakes, cookies), reduce intake of potatoes, white bread, white pasta, and white rice. Look for whole grain options, oat flour or almond flour.  Each meal should contain half fruits/vegetables, one quarter protein, and one quarter carbs (no bigger than a computer mouse).  Cut down on sweet beverages. This includes juice, soda, and sweet tea. Also watch fruit intake, though this is a healthier sweet option, it still contains natural sugar! Limit to 3 servings daily.  Drink at least 1 glass of water with each meal and aim for at least 8 glasses per day  Exercise at least 150 minutes every week.

## 2023-07-19 NOTE — Assessment & Plan Note (Signed)
Chronic.  Check labs.  Get colonoscopy sch!

## 2023-07-19 NOTE — Assessment & Plan Note (Signed)
Chronic.  Controlled.  Continue trulicity 1.5mg  weekly.  Work on diet/exercise

## 2023-07-19 NOTE — Progress Notes (Signed)
Stable/at goal-continue same meds, diet/exercise!

## 2023-09-06 ENCOUNTER — Ambulatory Visit: Admitting: Family Medicine

## 2023-09-06 ENCOUNTER — Encounter: Payer: Self-pay | Admitting: Family Medicine

## 2023-09-06 VITALS — BP 116/74 | HR 79 | Ht 67.0 in | Wt 258.4 lb

## 2023-09-06 DIAGNOSIS — M79645 Pain in left finger(s): Secondary | ICD-10-CM

## 2023-09-06 DIAGNOSIS — Z23 Encounter for immunization: Secondary | ICD-10-CM

## 2023-09-06 NOTE — Patient Instructions (Signed)
It was very nice to see you today!  [facility] at [place] on the 1st floor Phone number [phone]    PLEASE NOTE:  If you had any lab tests please let us know if you have not heard back within a few days. You may see your results on MyChart before we have a chance to review them but we will give you a call once they are reviewed by [place]. If we ordered any referrals today, please let us know if you have not heard from their office within the next week.   Please try these tips to maintain a healthy lifestyle:  Eat most of your calories during the day when you are active. Eliminate processed foods including packaged sweets (pies, cakes, cookies), reduce intake of potatoes, white bread, white pasta, and white rice. Look for whole grain options, oat flour or almond flour.  Each meal should contain half fruits/vegetables, one quarter protein, and one quarter carbs (no bigger than a computer mouse).  Cut down on sweet beverages. This includes juice, soda, and sweet tea. Also watch fruit intake, though this is a healthier sweet option, it still contains natural sugar! Limit to 3 servings daily.  Drink at least 1 glass of water with each meal and aim for at least 8 glasses per day  Exercise at least 150 minutes every week.
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
## 2023-09-07 ENCOUNTER — Other Ambulatory Visit: Payer: Self-pay

## 2023-09-07 ENCOUNTER — Encounter: Payer: Self-pay | Admitting: Family Medicine

## 2023-09-07 ENCOUNTER — Ambulatory Visit: Admitting: Family Medicine

## 2023-09-07 VITALS — BP 120/80 | HR 88 | Ht 67.0 in | Wt 258.0 lb

## 2023-09-07 DIAGNOSIS — M65312 Trigger thumb, left thumb: Secondary | ICD-10-CM | POA: Insufficient documentation

## 2023-09-07 DIAGNOSIS — M79645 Pain in left finger(s): Secondary | ICD-10-CM

## 2023-09-07 NOTE — Patient Instructions (Addendum)
Thank you for coming in today.   I think you have trigger thumb.   Please use Voltaren gel (Generic Diclofenac Gel) up to 4x daily for pain as needed.  This is available over-the-counter as both the name brand Voltaren gel and the generic diclofenac gel.   Use the double bandaid splint for about 2 weeks.   If this shot does not work next step would usually be a referral to a Hydrographic surveyor.   I can repeat this injection if needed.
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
## 2023-11-14 ENCOUNTER — Telehealth: Payer: Self-pay

## 2023-11-14 NOTE — Telephone Encounter (Signed)
 Copied from CRM [id number]. Topic: Clinical - Medication Question >> Nov 14, 2023  1:39 PM Deaijah H wrote: Reason for CRM: Patient called in to check if is it okay to take Biotin hair skin and nail gummies 5000 mcg along with prescriptions that she's already on // [phone]  Please see pt question and advise if ok for patient to take OTC supplement

## 2023-11-14 NOTE — Telephone Encounter (Signed)
 Contacted patient and advised ok to take supplements per PCP, pt verbalized understanding

## 2023-12-01 ENCOUNTER — Other Ambulatory Visit: Payer: Self-pay | Admitting: Family Medicine

## 2023-12-01 DIAGNOSIS — E1169 Type 2 diabetes mellitus with other specified complication: Secondary | ICD-10-CM

## 2023-12-01 DIAGNOSIS — E119 Type 2 diabetes mellitus without complications: Secondary | ICD-10-CM

## 2023-12-01 NOTE — Telephone Encounter (Signed)
Copied from CRM [id number]. Topic: Clinical - Medication Refill >> Dec 01, 2023 11:11 AM Corin V wrote: Most Recent Primary Care Visit:  Provider: Ruthine Dose, ANN MARIE  Department: [facility]  Visit Type: OFFICE VISIT  Date: 09/06/2023  Medication: ezetimibe (ZETIA) 10 MG tablet Dulaglutide (TRULICITY) 1.5 MG/0.5ML SOPN  Has the patient contacted their pharmacy? Yes- no refills remaining (Agent: If no, request that the patient contact the pharmacy for the refill. If patient does not wish to contact the pharmacy document the reason why and proceed with request.) (Agent: If yes, when and what did the pharmacy advise?)  Is this the correct pharmacy for this prescription? Yes If no, delete pharmacy and type the correct one.  This is the patient's preferred pharmacy:   [facility] - [place], [place] - [address] [place] [address] Phone: [phone] Fax: [phone]   Has the prescription been filled recently? No  Is the patient out of the medication? Yes  Has the patient been seen for an appointment in the last year OR does the patient have an upcoming appointment? Yes  Can we respond through MyChart? Yes  Agent: Please be advised that Rx refills may take up to 3 business days. We ask that you follow-up with your pharmacy.

## 2023-12-16 ENCOUNTER — Other Ambulatory Visit: Payer: Self-pay | Admitting: Family Medicine

## 2023-12-16 DIAGNOSIS — E119 Type 2 diabetes mellitus without complications: Secondary | ICD-10-CM

## 2024-01-16 ENCOUNTER — Ambulatory Visit: Admitting: Family Medicine

## 2024-01-18 ENCOUNTER — Encounter: Payer: Self-pay | Admitting: Family Medicine

## 2024-01-18 ENCOUNTER — Ambulatory Visit: Admitting: Family Medicine

## 2024-01-18 VITALS — BP 133/82 | HR 76 | Temp 97.6°F | Resp 18 | Ht 67.0 in | Wt 259.2 lb

## 2024-01-18 DIAGNOSIS — E1169 Type 2 diabetes mellitus with other specified complication: Secondary | ICD-10-CM

## 2024-01-18 DIAGNOSIS — Z7985 Long-term (current) use of injectable non-insulin antidiabetic drugs: Secondary | ICD-10-CM | POA: Diagnosis not present

## 2024-01-18 DIAGNOSIS — E785 Hyperlipidemia, unspecified: Secondary | ICD-10-CM

## 2024-01-18 DIAGNOSIS — E119 Type 2 diabetes mellitus without complications: Secondary | ICD-10-CM

## 2024-01-18 DIAGNOSIS — E611 Iron deficiency: Secondary | ICD-10-CM

## 2024-01-18 LAB — COMPREHENSIVE METABOLIC PANEL
ALT: 16 U/L (ref 0–35)
AST: 20 U/L (ref 0–37)
Albumin: 3.9 g/dL (ref 3.5–5.2)
Alkaline Phosphatase: 93 U/L (ref 39–117)
BUN: 13 mg/dL (ref 6–23)
CO2: 32 meq/L (ref 19–32)
Calcium: 9.3 mg/dL (ref 8.4–10.5)
Chloride: 105 meq/L (ref 96–112)
Creatinine, Ser: 0.95 mg/dL (ref 0.40–1.20)
GFR: 64.13 mL/min (ref >60.00)
Glucose, Bld: 88 mg/dL (ref 70–99)
Potassium: 4.5 meq/L (ref 3.5–5.1)
Sodium: 142 meq/L (ref 135–145)
Total Bilirubin: 0.6 mg/dL (ref 0.2–1.2)
Total Protein: 7.3 g/dL (ref 6.0–8.3)

## 2024-01-18 LAB — MICROALBUMIN / CREATININE URINE RATIO
Creatinine,U: 239.8 mg/dL
Microalb Creat Ratio: 3.4 mg/g (ref 0.0–30.0)
Microalb, Ur: 0.8 mg/dL (ref 0.0–1.9)

## 2024-01-18 LAB — CBC WITH DIFFERENTIAL/PLATELET
Basophils Absolute: 0 10*3/uL (ref 0.0–0.1)
Basophils Relative: 0.3 % (ref 0.0–3.0)
Eosinophils Absolute: 0.1 10*3/uL (ref 0.0–0.7)
Eosinophils Relative: 0.9 % (ref 0.0–5.0)
HCT: 40.4 % (ref 36.0–46.0)
Hemoglobin: 12.7 g/dL (ref 12.0–15.0)
Lymphocytes Relative: 27 % (ref 12.0–46.0)
Lymphs Abs: 2.7 10*3/uL (ref 0.7–4.0)
MCHC: 31.5 g/dL (ref 30.0–36.0)
MCV: 74 fl — ABNORMAL LOW (ref 78.0–100.0)
Monocytes Absolute: 0.6 10*3/uL (ref 0.1–1.0)
Monocytes Relative: 5.9 % (ref 3.0–12.0)
Neutro Abs: 6.5 10*3/uL (ref 1.4–7.7)
Neutrophils Relative %: 65.9 % (ref 43.0–77.0)
Platelets: 140 10*3/uL — ABNORMAL LOW (ref 150.0–400.0)
RBC: 5.47 Mil/uL — ABNORMAL HIGH (ref 3.87–5.11)
RDW: 16 % — ABNORMAL HIGH (ref 11.5–15.5)
WBC: 9.9 10*3/uL (ref 4.0–10.5)

## 2024-01-18 LAB — VITAMIN B12: Vitamin B-12: 671 pg/mL (ref 211–911)

## 2024-01-18 LAB — LIPID PANEL
Cholesterol: 148 mg/dL (ref 0–200)
HDL: 62 mg/dL (ref >39.00)
LDL Cholesterol: 75 mg/dL (ref 0–99)
NonHDL: 85.58
Total CHOL/HDL Ratio: 2
Triglycerides: 54 mg/dL (ref 0.0–149.0)
VLDL: 10.8 mg/dL (ref 0.0–40.0)

## 2024-01-18 LAB — HEMOGLOBIN A1C: Hgb A1c MFr Bld: 6.3 % (ref 4.6–6.5)

## 2024-01-18 LAB — IBC + FERRITIN
Ferritin: 119.8 ng/mL (ref 10.0–291.0)
Iron: 86 ug/dL (ref 42–145)
Saturation Ratios: 27.7 % (ref 20.0–50.0)
TIBC: 310.8 ug/dL (ref 250.0–450.0)
Transferrin: 222 mg/dL (ref 212.0–360.0)

## 2024-01-18 MED ORDER — TRULICITY 3 MG/0.5ML ~~LOC~~ SOAJ
3.0000 mg | SUBCUTANEOUS | 1 refills | Status: DC
Start: 2024-01-18 — End: 2024-05-10

## 2024-01-18 NOTE — Patient Instructions (Addendum)
 It was very nice to see you today! Get colonoscopy done.   Go to bathroom before feel it-like every 3-4 hrs.   Do [place] exercises   PLEASE NOTE:  If you had any lab tests please let us know if you have not heard back within a few days. You may see your results on MyChart before we have a chance to review them but we will give you a call once they are reviewed by [place]. If we ordered any referrals today, please let us know if you have not heard from their office within the next week.   Please try these tips to maintain a healthy lifestyle:  Eat most of your calories during the day when you are active. Eliminate processed foods including packaged sweets (pies, cakes, cookies), reduce intake of potatoes, white bread, white pasta, and white rice. Look for whole grain options, oat flour or almond flour.  Each meal should contain half fruits/vegetables, one quarter protein, and one quarter carbs (no bigger than a computer mouse).  Cut down on sweet beverages. This includes juice, soda, and sweet tea. Also watch fruit intake, though this is a healthier sweet option, it still contains natural sugar! Limit to 3 servings daily.  Drink at least 1 glass of water with each meal and aim for at least 8 glasses per day  Exercise at least 150 minutes every week.

## 2024-01-18 NOTE — Progress Notes (Signed)
 Subjective:     Patient ID: Angela Bates, female    DOB: [birth year], 63 y.o.   MRN: [id number]  Chief Complaint  Patient presents with   Medical Management of Chronic Issues    3 month follow-up for dm, cholesterol Fasting    Urinary Urgency    Started a while ago    HPI DM, type 2 - Managed by Trulicity 1.5 mg since 5/17. Reports her diet is controlled by reducing sugars, and she is exercising regularly. States she does not monitor glucose at home.  HLD - Managed by Zetia 10 mg. Tolerating well, denies side effects. Rash to statins  Iron def-still hasn't sch colon-can't get response.   Discussed the use of AI scribe software for clinical note transcription with the patient, who gave verbal consent to proceed.  History of Present Illness   The patient presents for follow-up of diabetes management and other chronic conditions.  She is managing her diabetes with Trulicity 1.5 mg weekly, reporting well-controlled blood sugars. She is actively working on her diet and exercises by walking, although she does not check her blood sugars regularly. She is concerned about her weight, noting no decrease despite efforts, and is considering increasing her physical activity.  For hyperlipidemia, she takes Zetia 10 mg daily due to a rash from statins and reports no issues with this medication.  She manages iron deficiency with daily iron supplements without significant gastrointestinal side effects such as constipation.  She experiences urinary urgency, stating that when she feels the need to urinate, she must go immediately or risk incontinence. There is no burning sensation during urination, and she does not consume a lot of caffeine. going on at least 6 months  She has not been able to schedule a colonoscopy despite attempts to contact the gastroenterology office. No dark stools or vaginal bleeding, and she is post-menopausal with a history of hysterectomy.       Health Maintenance Due   Topic Date Due   Colonoscopy  08/05/2021    Past Medical History:  Diagnosis Date   Diabetes mellitus without complication (HCC)    "borderline"   Hyperlipidemia     Past Surgical History:  Procedure Laterality Date   ABDOMINAL HYSTERECTOMY  11/08/2000   bso-bleeding     Current Outpatient Medications:    Cholecalciferol (VITAMIN D3) 1000 units CAPS, Take by mouth., Disp: , Rfl:    Dulaglutide (TRULICITY) 3 MG/0.5ML SOAJ, Inject 3 mg as directed once a week., Disp: 6 mL, Rfl: 1   ezetimibe (ZETIA) 10 MG tablet, Take 1 tablet (10 mg total) by mouth daily., Disp: 90 tablet, Rfl: 3   ferrous sulfate 325 (65 FE) MG tablet, Take 1 tablet (325 mg total) by mouth daily with breakfast., Disp: 90 tablet, Rfl: 0   ibuprofen (ADVIL) 800 MG tablet, Take 800 mg by mouth., Disp: , Rfl:    Multiple Vitamin (MULTIVITAMIN) capsule, Take 1 capsule by mouth daily., Disp: , Rfl:   Allergies  Allergen Reactions   Ibuprofen Other (See Comments)   Statins Rash   ROS neg/noncontributory except as noted HPI/below      Objective:     BP 133/82   Pulse 76   Temp 97.6 F (36.4 C) (Temporal)   Resp 18   Ht 5\' 7"  (1.702 m)   Wt 259 lb 4 oz (117.6 kg)   SpO2 98%   BMI 40.60 kg/m  Wt Readings from Last 3 Encounters:  01/18/24 259 lb 4 oz (117.6 kg)  09/07/23 258 lb (117 kg)  09/06/23 258 lb 6.4 oz (117.2 kg)    Physical Exam   Gen: WDWN NAD HEENT: NCAT, conjunctiva not injected, sclera nonicteric NECK:  supple, no thyromegaly, no nodes, no carotid bruits CARDIAC: RRR, S1S2+, no murmur. DP 2+B LUNGS: CTAB. No wheezes ABDOMEN:  BS+, soft, NTND, No HSM, no masses EXT:  no edema MSK: no gross abnormalities.  NEURO: A&O x3.  CN II-XII intact.  PSYCH: normal mood. Good eye contact        Assessment & Plan:  Controlled type 2 diabetes mellitus without complication, without long-term current use of insulin (HCC) -     Comprehensive metabolic panel -     Hemoglobin A1c -      Microalbumin / creatinine urine ratio -     Trulicity; Inject 3 mg as directed once a week.  Dispense: 6 mL; Refill: 1  Hyperlipidemia associated with type 2 diabetes mellitus (HCC) -     Comprehensive metabolic panel -     Lipid panel  Iron deficiency -     CBC with Differential/Platelet -     IBC + Ferritin -     Vitamin B12  Assessment and Plan    Type 2 Diabetes Mellitus   Type 2 Diabetes Mellitus is managed with Trulicity 1.5 mg weekly. Blood glucose levels are stable, but weight management remains a concern. She is not monitoring blood glucose at home. Discussed switching to Ozempic or Mounjaro for better weight control, pending insurance approval. She acknowledges the need for increased exercise and dietary control. Increasing Trulicity to 3 mg weekly was discussed to aid weight management. Increase Trulicity to 3 mg weekly. Encourage increased physical activity and dietary control. Send Trulicity prescription to [facility].   Iron Deficiency Anemia   She is taking iron supplements daily for iron deficiency anemia. Suggested reducing frequency to every other day, depending on lab results. Reports no significant gastrointestinal side effects. Continue current iron supplementation. Evaluate lab results to determine if frequency can be reduced.  Hyperlipidemia   She takes Zetia 10 mg daily for hyperlipidemia due to a rash from statins. Reports no issues with Zetia. Continue Zetia 10 mg daily.  Urinary Urgency   She reports urinary urgency, needing to urinate immediately upon feeling the urge, with no dysuria or change in stream. Attributed to age-related changes. Suggested behavioral modifications and Kegel exercises to strengthen pelvic muscles. Advise timed voiding every 3-4 hours. Instruct on Kegel exercises and provide printed instructions.  General Health Maintenance   She has not yet scheduled a colonoscopy despite previous recommendations and is unable to contact the  gastroenterology office for scheduling. Emphasized the importance of the procedure and suggested visiting the office in person to schedule. Encourage visiting gastroenterology office in person to schedule colonoscopy.        Return in about 6 months (around 07/20/2024) for DM, cholesterol.    Angelena Sole, MD

## 2024-01-22 ENCOUNTER — Encounter: Payer: Self-pay | Admitting: Family Medicine

## 2024-01-22 NOTE — Progress Notes (Signed)
 Labs stable 1.  A1C(3 month average of sugars) is elevated.  This is considered PreDiabetes.  Work on diet-decrease sugars and starches and aim for 30 minutes of exercise 5 days/week to prevent progression to diabetes

## 2024-04-12 LAB — HM DIABETES EYE EXAM

## 2024-05-09 ENCOUNTER — Telehealth: Payer: Self-pay | Admitting: *Deleted

## 2024-05-09 ENCOUNTER — Other Ambulatory Visit (HOSPITAL_COMMUNITY): Payer: Self-pay | Admitting: Family Medicine

## 2024-05-09 ENCOUNTER — Other Ambulatory Visit: Payer: Self-pay | Admitting: *Deleted

## 2024-05-09 DIAGNOSIS — Z1231 Encounter for screening mammogram for malignant neoplasm of breast: Secondary | ICD-10-CM

## 2024-05-09 DIAGNOSIS — Z1211 Encounter for screening for malignant neoplasm of colon: Secondary | ICD-10-CM

## 2024-05-09 NOTE — Telephone Encounter (Signed)
 Copied from CRM [id number]. Topic: General - Other >> May 09, 2024  9:27 AM Rosina BIRCH wrote: Reason for CRM: patient called stating she was told by the doctor that she need to get a colonoscopy done so [facility] and they can not find the records to where she had a colonoscopy done there but they stated that the patient need a referral from her doctor [phone]  Referral placed as requested to  [facility] [address] [place], [place], [place] [address]    Phone # [phone]

## 2024-05-09 NOTE — Telephone Encounter (Signed)
 Copied from CRM [id number]. Topic: General - Other >> May 09, 2024 10:33 AM Aleatha C wrote: Reason for CRM: Patient calling to  give name of the facility for her Referral to [facility] the facility is  [facility] [address], [place], [place] [address] Phone # [phone]  Referral placed.

## 2024-05-10 ENCOUNTER — Other Ambulatory Visit: Payer: Self-pay | Admitting: Family Medicine

## 2024-05-10 DIAGNOSIS — E119 Type 2 diabetes mellitus without complications: Secondary | ICD-10-CM

## 2024-05-10 MED ORDER — TRULICITY 3 MG/0.5ML ~~LOC~~ SOAJ: 3.0000 mg | SUBCUTANEOUS | 1 refills | Status: DC

## 2024-05-10 NOTE — Telephone Encounter (Unsigned)
 Copied from CRM [id number]. Topic: Clinical - Medication Refill >> May 10, 2024  8:21 AM Burnard DEL wrote: Medication: Dulaglutide  (TRULICITY ) 3 MG/0.5ML SOAJ  Has the patient contacted their pharmacy? No (Agent: If no, request that the patient contact the pharmacy for the refill. If patient does not wish to contact the pharmacy document the reason why and proceed with request.) (Agent: If yes, when and what did the pharmacy advise?)  This is the patient's preferred pharmacy:    [facility] - [place], [place] - [address] [place] [address] Phone: [phone] Fax: [phone]  Is this the correct pharmacy for this prescription? Yes If no, delete pharmacy and type the correct one.   Has the prescription been filled recently? No  Is the patient out of the medication? No(1 injection for next week left)  Has the patient been seen for an appointment in the last year OR does the patient have an upcoming appointment? Yes  Can we respond through MyChart? Yes  Agent: Please be advised that Rx refills may take up to 3 business days. We ask that you follow-up with your pharmacy.

## 2024-05-18 ENCOUNTER — Ambulatory Visit (HOSPITAL_COMMUNITY)
Admission: RE | Admit: 2024-05-18 | Discharge: 2024-05-18 | Disposition: A | Discharge status: Home / Self Care | Source: Ambulatory Visit | Attending: Family Medicine | Admitting: Family Medicine

## 2024-05-18 ENCOUNTER — Encounter (HOSPITAL_COMMUNITY): Payer: Self-pay

## 2024-05-18 DIAGNOSIS — Z1231 Encounter for screening mammogram for malignant neoplasm of breast: Secondary | ICD-10-CM | POA: Diagnosis present

## 2024-05-23 ENCOUNTER — Ambulatory Visit: Payer: Self-pay | Admitting: Family Medicine

## 2024-07-04 LAB — HM COLONOSCOPY

## 2024-07-24 ENCOUNTER — Ambulatory Visit: Admitting: Family Medicine

## 2024-07-24 ENCOUNTER — Encounter: Payer: Self-pay | Admitting: Family Medicine

## 2024-07-24 VITALS — BP 113/76 | HR 62 | Temp 97.5°F | Resp 18 | Ht 67.0 in | Wt 253.2 lb

## 2024-07-24 DIAGNOSIS — G720 Drug-induced myopathy: Secondary | ICD-10-CM

## 2024-07-24 DIAGNOSIS — E611 Iron deficiency: Secondary | ICD-10-CM

## 2024-07-24 DIAGNOSIS — E119 Type 2 diabetes mellitus without complications: Secondary | ICD-10-CM

## 2024-07-24 DIAGNOSIS — Z7985 Long-term (current) use of injectable non-insulin antidiabetic drugs: Secondary | ICD-10-CM

## 2024-07-24 DIAGNOSIS — E559 Vitamin D deficiency, unspecified: Secondary | ICD-10-CM | POA: Diagnosis not present

## 2024-07-24 DIAGNOSIS — E785 Hyperlipidemia, unspecified: Secondary | ICD-10-CM | POA: Diagnosis not present

## 2024-07-24 DIAGNOSIS — E1169 Type 2 diabetes mellitus with other specified complication: Secondary | ICD-10-CM | POA: Diagnosis not present

## 2024-07-24 DIAGNOSIS — T466X5A Adverse effect of antihyperlipidemic and antiarteriosclerotic drugs, initial encounter: Secondary | ICD-10-CM

## 2024-07-24 DIAGNOSIS — Z23 Encounter for immunization: Secondary | ICD-10-CM | POA: Diagnosis not present

## 2024-07-24 LAB — CBC WITH DIFFERENTIAL/PLATELET
Basophils Absolute: 0.1 K/uL (ref 0.0–0.1)
Basophils Relative: 0.7 % (ref 0.0–3.0)
Eosinophils Absolute: 0.1 K/uL (ref 0.0–0.7)
Eosinophils Relative: 1.2 % (ref 0.0–5.0)
HCT: 38 % (ref 36.0–46.0)
Hemoglobin: 11.9 g/dL — ABNORMAL LOW (ref 12.0–15.0)
Lymphocytes Relative: 31.3 % (ref 12.0–46.0)
Lymphs Abs: 2.6 K/uL (ref 0.7–4.0)
MCHC: 31.3 g/dL (ref 30.0–36.0)
MCV: 71.6 fl — ABNORMAL LOW (ref 78.0–100.0)
Monocytes Absolute: 0.5 K/uL (ref 0.1–1.0)
Monocytes Relative: 5.5 % (ref 3.0–12.0)
Neutro Abs: 5.1 K/uL (ref 1.4–7.7)
Neutrophils Relative %: 61.3 % (ref 43.0–77.0)
Platelets: 183 K/uL (ref 150.0–400.0)
RBC: 5.31 Mil/uL — ABNORMAL HIGH (ref 3.87–5.11)
RDW: 15.7 % — ABNORMAL HIGH (ref 11.5–15.5)
WBC: 8.3 K/uL (ref 4.0–10.5)

## 2024-07-24 LAB — VITAMIN B12: Vitamin B-12: 993 pg/mL — ABNORMAL HIGH (ref 211–911)

## 2024-07-24 LAB — VITAMIN D 25 HYDROXY (VIT D DEFICIENCY, FRACTURES): VITD: 40.03 ng/mL (ref 30.00–100.00)

## 2024-07-24 LAB — IBC + FERRITIN
Ferritin: 154.8 ng/mL (ref 10.0–291.0)
Iron: 92 ug/dL (ref 42–145)
Saturation Ratios: 34 % (ref 20.0–50.0)
TIBC: 270.2 ug/dL (ref 250.0–450.0)
Transferrin: 193 mg/dL — ABNORMAL LOW (ref 212.0–360.0)

## 2024-07-24 LAB — COMPREHENSIVE METABOLIC PANEL WITH GFR
ALT: 14 U/L (ref 0–35)
AST: 16 U/L (ref 0–37)
Albumin: 3.7 g/dL (ref 3.5–5.2)
Alkaline Phosphatase: 71 U/L (ref 39–117)
BUN: 12 mg/dL (ref 6–23)
CO2: 27 meq/L (ref 19–32)
Calcium: 9.1 mg/dL (ref 8.4–10.5)
Chloride: 110 meq/L (ref 96–112)
Creatinine, Ser: 1.03 mg/dL (ref 0.40–1.20)
GFR: 57.99 mL/min — ABNORMAL LOW (ref >60.00)
Glucose, Bld: 90 mg/dL (ref 70–99)
Potassium: 4 meq/L (ref 3.5–5.1)
Sodium: 137 meq/L (ref 135–145)
Total Bilirubin: 0.6 mg/dL (ref 0.2–1.2)
Total Protein: 7 g/dL (ref 6.0–8.3)

## 2024-07-24 LAB — HEMOGLOBIN A1C: Hgb A1c MFr Bld: 6.6 % — ABNORMAL HIGH (ref 4.6–6.5)

## 2024-07-24 MED ORDER — EZETIMIBE 10 MG PO TABS
10.0000 mg | ORAL_TABLET | Freq: Every day | ORAL | 3 refills | Status: AC
Start: 2024-07-24 — End: ?

## 2024-07-24 NOTE — Progress Notes (Signed)
 Subjective:     Patient ID: Angela Bates, female    DOB: 1961/03/31, 63 y.o.   MRN: [id number]  Chief Complaint  Patient presents with   Medical Management of Chronic Issues    6 month follow-up Fasting     HPI Discussed the use of AI scribe software for clinical note transcription with the patient, who gave verbal consent to proceed.  History of Present Illness Angela Bates is a 63 year old female with diabetes and hyperlipidemia who presents for management of her diabetes and cholesterol.  She is currently on Trulicity  3 mg weekly for diabetes and Zetia  10 mg for hyperlipidemia. She does not regularly monitor her blood glucose levels and reports no symptoms of dizziness or lightheadedness. She is actively working on her diet and exercise, aiming to incorporate 20 to 30 minutes of exercise before bed. She has lost six pounds recently and is motivated to continue losing weight. She is satisfied with Trulicity , stating, 'I feel like with that, if I truly did what I'm supposed to do, I would lose more.'  She takes iron every other day and vitamin D  regularly. She recalls having a reaction to statins in the past, which is why she is on Zetia . She purchases her iron over the counter and uses [facility] for her Trulicity  and Zetia  prescriptions.  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 She notes that wearing certain shoes helps.  No major headaches, coughing, wheezing, shortness of breath, chest pain, vomiting, or diarrhea. 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    Health Maintenance Due  Topic Date Due   HEMOGLOBIN A1C  07/20/2024    Past Medical History:  Diagnosis Date   Diabetes mellitus without complication (HCC)    borderline   Hyperlipidemia     Past Surgical History:  Procedure Laterality  Date   ABDOMINAL HYSTERECTOMY  11/08/2000   bso-bleeding     Current Outpatient Medications:    Cholecalciferol  (VITAMIN D3) 1000 units CAPS, Take by mouth., Disp: , Rfl:    Dulaglutide  (TRULICITY ) 3 MG/0.5ML SOAJ, Inject 3 mg as directed once a week., Disp: 6 mL, Rfl: 1   ferrous sulfate  325 (65 FE) MG tablet, Take 1 tablet (325 mg total) by mouth daily with breakfast., Disp: 90 tablet, Rfl: 0   ibuprofen (ADVIL) 800 MG tablet, Take 800 mg by mouth., Disp: , Rfl:    Multiple Vitamin (MULTIVITAMIN) capsule, Take 1 capsule by mouth daily., Disp: , Rfl:    ezetimibe  (ZETIA ) 10 MG tablet, Take 1 tablet (10 mg total) by mouth daily., Disp: 90 tablet, Rfl: 3  Allergies  Allergen Reactions   Ibuprofen Other (See Comments)   Statins Rash   ROS neg/noncontributory except as noted HPI/below      Objective:     BP 113/76   Pulse 62   Temp (!) 97.5 F (36.4 C) (Temporal)   Resp 18   Ht 5' 7 (1.702 m)   Wt 253 lb 4 oz (114.9 kg)   SpO2 97%   BMI 39.66 kg/m  Wt Readings from Last 3 Encounters:  07/24/24 253 lb 4 oz (114.9 kg)  01/18/24 259 lb 4 oz (117.6 kg)  09/07/23 258 lb (117 kg)    Physical Exam   Gen: WDWN NAD HEENT: NCAT, conjunctiva not injected, sclera nonicteric NECK:  supple, no  thyromegaly, no nodes, no carotid bruits CARDIAC: RRR, S1S2+, no murmur. DP 2+B LUNGS: CTAB. No wheezes ABDOMEN:  BS+, soft, NTND, No HSM, no masses EXT:  no edema MSK: no gross abnormalities.  NEURO: A&O x3.  CN II-XII intact.  PSYCH: normal mood. Good eye contact     Assessment & Plan:  Controlled type 2 diabetes mellitus without complication, without long-term current use of insulin (HCC) -     Hemoglobin A1c -     Comprehensive metabolic panel with GFR  Hyperlipidemia associated with type 2 diabetes mellitus (HCC) -     Comprehensive metabolic panel with GFR -     Ezetimibe ; Take 1 tablet (10 mg total) by mouth daily.  Dispense: 90 tablet; Refill: 3  Iron deficiency -      CBC with Differential/Platelet -     IBC + Ferritin -     Vitamin B12  Vitamin D  deficiency -     VITAMIN D  25 Hydroxy (Vit-D Deficiency, Fractures)  Long-term current use of injectable noninsulin antidiabetic medication  Statin myopathy  Assessment and Plan Assessment & Plan Type 2 diabetes mellitus   Type 2 diabetes mellitus is managed with Trulicity  3 mg weekly. She is asymptomatic for hypoglycemia or hyperglycemia and has lost 6 pounds through diet and exercise. She prefers Trulicity  over Mounjaro or Ozempic for better adherence. Continue Trulicity  3 mg weekly, encourage ongoing diet and exercise, and refill the prescription through [facility].  Hyperlipidemia   Hyperlipidemia is managed with Zetia  10 mg due to adverse reactions to statins. Refill Zetia  prescription through [facility].  Iron deficiency   Iron deficiency is managed with over-the-counter iron supplements every other day. She is also taking vitamin D  supplements. Continue both iron supplements every other day and vitamin D  supplementation.  Lower extremity edema   Lower extremity edema, particularly in one leg, is likely due to venous insufficiency and is exacerbated by prolonged standing. Recommend wearing compression stockings, especially when standing for long periods.    Return in about 6 months (around 01/21/2025) for chronic follow-up.  Jenkins CHRISTELLA Carrel, MD

## 2024-07-24 NOTE — Patient Instructions (Signed)
It was very nice to see you today!  Compression stockings   PLEASE NOTE:  If you had any lab tests please let us know if you have not heard back within a few days. You may see your results on MyChart before we have a chance to review them but we will give you a call once they are reviewed by [place]. If we ordered any referrals today, please let us know if you have not heard from their office within the next week.   Please try these tips to maintain a healthy lifestyle:  Eat most of your calories during the day when you are active. Eliminate processed foods including packaged sweets (pies, cakes, cookies), reduce intake of potatoes, white bread, white pasta, and white rice. Look for whole grain options, oat flour or almond flour.  Each meal should contain half fruits/vegetables, one quarter protein, and one quarter carbs (no bigger than a computer mouse).  Cut down on sweet beverages. This includes juice, soda, and sweet tea. Also watch fruit intake, though this is a healthier sweet option, it still contains natural sugar! Limit to 3 servings daily.  Drink at least 1 glass of water with each meal and aim for at least 8 glasses per day  Exercise at least 150 minutes every week.

## 2024-07-25 ENCOUNTER — Ambulatory Visit: Payer: Self-pay | Admitting: Family Medicine

## 2024-07-25 NOTE — Progress Notes (Signed)
 Labs stable/at goal.  Same meds.

## 2024-11-15 ENCOUNTER — Telehealth: Payer: Self-pay | Admitting: Family Medicine

## 2024-11-15 ENCOUNTER — Other Ambulatory Visit: Payer: Self-pay | Admitting: Family Medicine

## 2024-11-15 DIAGNOSIS — E119 Type 2 diabetes mellitus without complications: Secondary | ICD-10-CM

## 2024-11-15 NOTE — Telephone Encounter (Signed)
 LVM for Patient to schedule OV in March- per Provider

## 2024-11-15 NOTE — Telephone Encounter (Signed)
 Needs to sch appt for March
